# Patient Record
Sex: Female | Born: 1960
Health system: Southern US, Community
[De-identification: ages and names within clinical notes are randomized; demographics above are authoritative.]

## PROBLEM LIST (undated history)

## (undated) DIAGNOSIS — D242 Benign neoplasm of left breast: Secondary | ICD-10-CM

## (undated) DIAGNOSIS — E119 Type 2 diabetes mellitus without complications: Secondary | ICD-10-CM

## (undated) DIAGNOSIS — D249 Benign neoplasm of unspecified breast: Secondary | ICD-10-CM

## (undated) HISTORY — DX: Benign neoplasm of left breast: D24.2

## (undated) HISTORY — DX: Type 2 diabetes mellitus without complications: E11.9

## (undated) HISTORY — DX: Benign neoplasm of unspecified breast: D24.9

---

## 1993-08-14 HISTORY — PX: FINGER SURGERY: SHX640

## 1993-08-14 HISTORY — PX: CHOLECYSTECTOMY: SHX55

## 2000-08-14 HISTORY — PX: CHEST SURGERY: SHX595

## 2008-08-14 HISTORY — PX: BREAST EXCISIONAL BIOPSY: SUR124

## 2008-08-14 HISTORY — PX: BREAST SURGERY: SHX581

## 2008-10-21 ENCOUNTER — Ambulatory Visit: Payer: Self-pay | Admitting: Family Medicine

## 2008-11-11 ENCOUNTER — Ambulatory Visit: Payer: Self-pay | Admitting: Family Medicine

## 2009-03-24 ENCOUNTER — Ambulatory Visit: Payer: Self-pay | Admitting: General Surgery

## 2009-04-01 ENCOUNTER — Ambulatory Visit: Payer: Self-pay | Admitting: General Surgery

## 2009-12-06 ENCOUNTER — Ambulatory Visit: Payer: Self-pay | Admitting: General Surgery

## 2011-01-13 ENCOUNTER — Ambulatory Visit: Payer: Self-pay | Admitting: General Surgery

## 2011-08-11 ENCOUNTER — Emergency Department: Payer: Self-pay | Admitting: *Deleted

## 2012-01-18 ENCOUNTER — Ambulatory Visit: Payer: Self-pay | Admitting: General Surgery

## 2013-01-20 ENCOUNTER — Ambulatory Visit: Payer: Self-pay | Admitting: General Surgery

## 2013-01-22 ENCOUNTER — Encounter: Payer: Self-pay | Admitting: General Surgery

## 2013-02-03 ENCOUNTER — Ambulatory Visit (INDEPENDENT_AMBULATORY_CARE_PROVIDER_SITE_OTHER): Payer: BC Managed Care – PPO | Admitting: General Surgery

## 2013-02-03 ENCOUNTER — Encounter: Payer: Self-pay | Admitting: General Surgery

## 2013-02-03 VITALS — BP 142/80 | HR 78 | Resp 12 | Ht <= 58 in | Wt 183.0 lb

## 2013-02-03 DIAGNOSIS — D249 Benign neoplasm of unspecified breast: Secondary | ICD-10-CM

## 2013-02-03 HISTORY — DX: Benign neoplasm of unspecified breast: D24.9

## 2013-02-03 NOTE — Progress Notes (Signed)
Patient ID: Cindy Shah, female   DOB: 09/04/1959, 52 y.o.   MRN: [id number]  Chief Complaint  Patient presents with  . Follow-up    mammogram    HPI Cindy Shah is a 52 y.o. female who presents for a breast evaluation. The most recent mammogram was done on 01/20/13 cat 2.  Patient does perform regular self breast checks and gets regular mammograms done. No family history of breast cancer.    HPI  Past Medical History  Diagnosis Date  . Benign neoplasm of breast     Right  . Diabetes mellitus without complication     NIDDM    Past Surgical History  Procedure Laterality Date  . Breast surgery Right 2010    Mass excision  . Cesarean section  1999  . Finger surgery  1995  . Chest surgery  2002  . Cholecystectomy  1995    History reviewed. No pertinent family history.  Social History History  Substance Use Topics  . Smoking status: Never Smoker   . Smokeless tobacco: Never Used  . Alcohol Use: No    No Known Allergies  No current outpatient prescriptions on file.   No current facility-administered medications for this visit.    Review of Systems Review of Systems  Constitutional: Negative.   Respiratory: Negative.   Cardiovascular: Negative.     Blood pressure 142/80, pulse 78, resp. rate 12, height 4\' 7"  (1.397 m), weight 183 lb (83.008 kg).  Physical Exam Physical Exam  Constitutional: She is oriented to person, place, and time. She appears well-developed and well-nourished.  Eyes: No scleral icterus.  Pulmonary/Chest: Right breast exhibits no inverted nipple, no mass, no nipple discharge, no skin change and no tenderness. Left breast exhibits no inverted nipple, no mass, no nipple discharge, no skin change and no tenderness.  Lymphadenopathy:    She has no cervical adenopathy.    She has no axillary adenopathy.  Neurological: She is alert and oriented to person, place, and time.  Skin: Skin is warm and dry.    Data Reviewed Mammogram  reviewed   Assessment    Stable exam     Plan     Patient to return one year bilateral screening mammogram        Houston Zapien G 02/03/2013, 9:13 PM

## 2013-02-03 NOTE — Patient Instructions (Addendum)
Patient to return one year bilateral screening mammogram.  

## 2013-03-19 ENCOUNTER — Ambulatory Visit: Payer: Self-pay | Admitting: Family Medicine

## 2014-01-27 ENCOUNTER — Encounter: Payer: Self-pay | Admitting: General Surgery

## 2014-02-06 ENCOUNTER — Encounter: Payer: Self-pay | Admitting: General Surgery

## 2014-02-09 ENCOUNTER — Ambulatory Visit (INDEPENDENT_AMBULATORY_CARE_PROVIDER_SITE_OTHER): Payer: BC Managed Care – PPO | Admitting: General Surgery

## 2014-02-09 ENCOUNTER — Encounter: Payer: Self-pay | Admitting: General Surgery

## 2014-02-09 ENCOUNTER — Other Ambulatory Visit: Payer: BC Managed Care – PPO

## 2014-02-09 VITALS — BP 150/82 | HR 76 | Resp 14 | Ht <= 58 in | Wt 183.0 lb

## 2014-02-09 DIAGNOSIS — D249 Benign neoplasm of unspecified breast: Secondary | ICD-10-CM

## 2014-02-09 NOTE — Patient Instructions (Addendum)
Patient to return on 02/11/14 at 8:30am for left breast biopsy.  Call office for any new breast issues or concerns.

## 2014-02-09 NOTE — Progress Notes (Signed)
Patient ID: Cindy Shah, female   DOB: 09/04/1959, 53 y.o.   MRN: [id number]  Chief Complaint  Patient presents with  . Follow-up    mammogram    HPI [name] is a 53 y.o. female who presents for a breast evaluation. The most recent mammogram was done on 01/27/14. Patient does perform regular self breast checks and gets regular mammograms done. No new problems with the breasts at this time.     HPI  Past Medical History  Diagnosis Date  . Benign neoplasm of breast     Right  . Diabetes mellitus without complication     NIDDM    Past Surgical History  Procedure Laterality Date  . Breast surgery Right 2010    Mass excision  . Cesarean section  1999  . Finger surgery  1995  . Chest surgery  2002  . Cholecystectomy  1995    History reviewed. No pertinent family history.  Social History History  Substance Use Topics  . Smoking status: Never Smoker   . Smokeless tobacco: Never Used  . Alcohol Use: No    No Known Allergies  No current outpatient prescriptions on file.   No current facility-administered medications for this visit.    Review of Systems Review of Systems  Constitutional: Negative.   Respiratory: Negative.   Cardiovascular: Negative.     Blood pressure 150/82, pulse 76, resp. rate 14, height 4\' 7"  (1.397 m), weight 183 lb (83.008 kg).  Physical Exam Physical Exam  Constitutional: She is oriented to person, place, and time. She appears well-developed and well-nourished.  Eyes: Conjunctivae are normal. No scleral icterus.  Neck: Neck supple. No thyromegaly present.  Cardiovascular: Normal rate and regular rhythm.   No murmur heard. Pulmonary/Chest: Effort normal and breath sounds normal. Right breast exhibits no inverted nipple, no mass, no nipple discharge, no skin change and no tenderness. Left breast exhibits no inverted nipple, no mass, no nipple discharge, no skin change and no tenderness.  Abdominal: Soft. Bowel sounds are  normal. There is no tenderness.  Lymphadenopathy:    She has no cervical adenopathy.    She has no axillary adenopathy.  Neurological: She is alert and oriented to person, place, and time.  Skin: Skin is warm and dry.    Data Reviewed Mammogram reviewed- developing nodule mid upper outer quadrant left breast  Ultrasound done here shows dominant lobulated 2 cm mass 1-2 o'clock.   Assessment  New large mass in left breast likely fibroadenoma but needs tissue confirmation.  Core biopsy of large mass discussed.     Plan    Patient to return on 02/11/14 at 8:30am for left breast biopsy. Patient agreeable.        SANKAR,SEEPLAPUTHUR G 02/10/2014, 11:58 AM

## 2014-02-10 ENCOUNTER — Encounter: Payer: Self-pay | Admitting: General Surgery

## 2014-02-11 ENCOUNTER — Ambulatory Visit (INDEPENDENT_AMBULATORY_CARE_PROVIDER_SITE_OTHER): Payer: BC Managed Care – PPO | Admitting: General Surgery

## 2014-02-11 ENCOUNTER — Encounter: Payer: Self-pay | Admitting: General Surgery

## 2014-02-11 ENCOUNTER — Other Ambulatory Visit: Payer: BC Managed Care – PPO

## 2014-02-11 VITALS — BP 124/76 | HR 82 | Resp 12 | Ht <= 58 in | Wt 182.0 lb

## 2014-02-11 DIAGNOSIS — D242 Benign neoplasm of left breast: Secondary | ICD-10-CM

## 2014-02-11 DIAGNOSIS — D249 Benign neoplasm of unspecified breast: Secondary | ICD-10-CM

## 2014-02-11 HISTORY — PX: BREAST EXCISIONAL BIOPSY: SUR124

## 2014-02-11 NOTE — Progress Notes (Signed)
Patient ID: Cindy Shah, female   DOB: 09/04/1959, 53 y.o.   MRN: [id number]  The patient presents for a left breast biopsy. No new complaints at this time.  Core biopsy of left breast mass completed today.  If path shows benign fibroadenoma wil recheck in 3 mos. Interpreter present for entire duration of visit and procedure.

## 2014-02-11 NOTE — Patient Instructions (Signed)
Patient to return in 1 week for a nurse visit. Follow up in 3 months.      CARE AFTER BREAST BIOPSY  1. Leave the dressing on that your doctor applied after surgery. It is waterproof. You may bathe, shower and/or swim. The dressing will probably remain intact until your return office visit. If the dressing comes off, you will see small strips of tape against your skin on the incision. Do not remove these strips.  2. You may want to use a gauze,cloth or similar protection in your bra to prevent rubbing against your dressing and incision. This is not necessary, but you may feel more comfortable doing so.  3. It is recommended that you wear a bra day and night to give support to the breast. This will prevent the weight of the breast from pulling on the incision.  4. Your breast will feel hard and lumpy under the incision. Do not be alarmed. This is the underlying stitching of tissue. Softening of this tissue will occur in time.  5. Make sure you call the office and schedule an appointment in one week after your surgery. The office phone number is [phone]. [facility] may have already done this for you.  6. You will notice about a week after your office visit that the strips of the tape on your incision will begin to loosen. These may then be removed.  7. Report to your doctor any of the following:  * Severe pain not relieved by your pain medication  *Redness of the incision  * Drainage from the incision  *Fever greater than 101 degrees

## 2014-02-12 ENCOUNTER — Telehealth: Payer: Self-pay | Admitting: *Deleted

## 2014-02-12 LAB — PATHOLOGY

## 2014-02-12 NOTE — Telephone Encounter (Signed)
Patient notified by interpreter, Montey Hora, that her biopsy came back benign-fibroadenoma. This patient will come for nurse check up next week and will see Dr. Jamal Collin for follow up in 3 months. Patient verbalizes understanding.

## 2014-02-18 ENCOUNTER — Ambulatory Visit (INDEPENDENT_AMBULATORY_CARE_PROVIDER_SITE_OTHER): Payer: Self-pay | Admitting: *Deleted

## 2014-02-18 DIAGNOSIS — D249 Benign neoplasm of unspecified breast: Secondary | ICD-10-CM

## 2014-02-18 DIAGNOSIS — D242 Benign neoplasm of left breast: Secondary | ICD-10-CM

## 2014-02-18 NOTE — Patient Instructions (Signed)
Continue self breast exams. Call office for any new breast issues or concerns. 

## 2014-02-18 NOTE — Progress Notes (Signed)
Patient here today for follow up post left breast biopsy.  No dressing, steristrip in place and aware it may come off in one week.  Minimal bruising noted.  The patient is aware that a heating pad may be used for comfort as needed.  Aware of pathology. Follow up as scheduled.

## 2014-05-14 ENCOUNTER — Encounter: Payer: Self-pay | Admitting: General Surgery

## 2014-05-14 ENCOUNTER — Ambulatory Visit (INDEPENDENT_AMBULATORY_CARE_PROVIDER_SITE_OTHER): Payer: BC Managed Care – PPO | Admitting: General Surgery

## 2014-05-14 VITALS — BP 142/82 | HR 78 | Resp 16 | Ht <= 58 in | Wt 176.0 lb

## 2014-05-14 DIAGNOSIS — D242 Benign neoplasm of left breast: Secondary | ICD-10-CM

## 2014-05-14 NOTE — Patient Instructions (Addendum)
Continue self breast exams. Call office for any new breast issues or concerns. Autoexamen de [employer]  Teacher, English as a foreign language) El autoexamen de mamas puede detectar problemas de manera temprana, prevenir complicaciones mdicas significativas y posiblemente salvar su vida. Al hacerlo, podr familiarizarse con el aspecto y forma de sus [place], y observar cambios. Esto le permite descubrir cambios de manera precoz. Este autoexamen [employer] ofrece la tranquilidad de que sus senos estn en buen [place]. Una forma de aprender qu es normal para sus mamas y si sufren modificaciones es Field seismologist un autoexamen.   Si encuentra un bulto o algo que no estaba presente anteriormente, lo mejor es ponerse en contacto con su mdico inmediatamente. Otro hallazgo que debe ser evaluado por su mdico es la secrecin del pezn, especialmente si es con sangre; cambios en la piel o enrojecimiento; reas donde la piel parece estar tironeada (retrada) o nuevos bultos o protuberancias. El dolor en los senos es rara vez se asocia con el cncer (malignidad), pero tambin debe ser evaluado por un mdico.  CMO REALIZAR EL AUTOEXAMEN DE MAMAS  El mejor momento para examinar sus mamas es a los 5 a 7 das despus de finalizado el perodo menstrual. Durante la menstruacin, las mamas estn ms abultadas y puede haber ms dificultad para Pension scheme manager modificaciones. Si no menstra, ha llegado a la menopausia, o le han extirpado el tero (histerectomia), usted debe examinar sus senos a intervalos regulares, por ejemplo cada mes. Si est amamantando, examine sus senos despus de alimentar al beb o despus de usar un extractor de [place]. Los implantes mamarios no disminuyen el riesgo de bultos o tumores, por lo que debe seguir realizando el autoexamen de [employer] se recomienda. Hable con su mdico acerca de cmo determinar la diferencia entre el implante y el tejido [place]. Adems, debe consultar cuanta presin debe hacer durante el examen. Con  el tiempo se familiarizar con las variaciones de las mamas y se sentir ms cmoda para Restaurant manager, fast food. Para el autoexamen deber quitarse toda la ropa de la cintura para [place].  1.  Observe sus senos y pezones. Prese frente a un espejo en una habitacin con buena iluminacin. Con las [employer] caderas, presione las manos firmemente [place]. Busque diferencias en la forma, el contorno y el tamao de un pecho al otro (asimetras). [place] asimetras se incluyen arrugas, depresiones o protuberancias. Tambin, busque cambios en la piel, como reas enrojecidas o escamosas. Busque cambios en los pezones, como secreciones, hoyuelos, cambios en la posicin, o enrojecimiento. 2. [place] cuidadosamente sus senos. Es mucho mejor [employer] en la ducha o en la baera, [place] jabn o cuando est recostada sobre su espalda. Coloque el brazo (en el lado de la mama que se examina) por arriba de la cabeza. Use las yemas (no las puntas) de los tres dedos centrales de la mano opuesta para palpar. Comience en la zona de la axila, haga crculos de  de pulgada (2 cm) y vaya superponindolos. Utilice 3 niveles diferentes de presin (ligero, medio y [place]) en cada crculo antes de pasar al siguiente. Se necesita una presin ligera para sentir los tejidos ms cercanos a la piel. La presin media ayudar a sentir el tejido [employer] un poco ms profundo, mientras que se necesita una presin firme para palpar el tejido que se encuentra cerca de las [place]. Continuar superponiendo crculos y vaya hacia abajo, hasta sentir las [place], por debajo del [place]. Luego mueva un espacio del ancho de un dedo  hacia el centro del cuerpo. Siga con los crculos del  de pulgada (2 cm) mientras va lentamente hacia la clavcula, cerca de la base del cuello. Contine con el examen hacia arriba y hacia abajo con las 3 intensidades de presin Librarian, academic a la mitad del pecho. Hgalo con cada seno cuidadosamente, buscando bultos o  modificaciones. 3. Debe llevar un registro escrito con los cambios o los hallazgos normales que encuentre para cada seno. Si registra esta informacin, no tiene que depender slo de la memoria para Financial trader, la sensibilidad o la ubicacin de los [place]. Anote en qu momento se encuentra del ciclo menstrual, si usted todava est menstruando. El tejido [employer] puede tener algunos bultos o tejidos engrosados. Sin embargo, consulte a su mdico si usted Location manager.   SOLICITE ATENCIN MDICA SI:   Observa cambios en la forma, en el contorno o el tamao de las mamas o los pezones.   Hay modificaciones en la piel, como zonas enrojecidas o escamosas en las mamas o en los pezones.   Tiene una secrecin anormal en los pezones.   Siente un nuevo bulto o reas engrosadas de [place] anormal.  Document Released: 07/31/2005 Document Revised: 07/17/2012 [facility] Patient Information 2015 [employer], [place]. This information is not intended to replace advice given to you by your health care provider. Make sure you discuss any questions you have with your health care provider. Follow up in 3 months with left diagnostic mammogram and office visit with ultrasound.

## 2014-05-14 NOTE — Progress Notes (Signed)
Patient ID: Cindy Shah, female   DOB: 09/04/1959, 53 y.o.   MRN: [id number]  Chief Complaint  Patient presents with  . Follow-up    HPI Cindy Shah Cindy Shah is a 53 y.o. female.  Here today for follow up left breast biopsy 02-11-14.  She had a dominant mass that showed fibroadenoma. No new breast issues.   Cindy Shah present for interpretation.  HPI  Past Medical History  Diagnosis Date  . Benign neoplasm of breast     Right  . Diabetes mellitus without complication     NIDDM    Past Surgical History  Procedure Laterality Date  . Cesarean section  1999  . Finger surgery  1995  . Chest surgery  2002  . Cholecystectomy  1995  . Breast surgery Right 2010    Mass excision  . Breast biopsy Left 02-11-14    fibroadenoma    History reviewed. No pertinent family history.  Social History History  Substance Use Topics  . Smoking status: Never Smoker   . Smokeless tobacco: Never Used  . Alcohol Use: No    No Known Allergies  No current outpatient prescriptions on file.   No current facility-administered medications for this visit.    Review of Systems Review of Systems  Blood pressure 142/82, pulse 78, resp. rate 16, height 4\' 7"  (1.397 m), weight 176 lb (79.833 kg).  Physical Exam Physical Exam  Constitutional: She is oriented to person, place, and time. She appears well-developed and well-nourished.  Pulmonary/Chest: Right breast exhibits no inverted nipple, no mass, no nipple discharge, no skin change and no tenderness. Left breast exhibits no inverted nipple, no mass, no nipple discharge, no skin change and no tenderness.  Lymphadenopathy:    She has no cervical adenopathy.    She has no axillary adenopathy.  Neurological: She is alert and oriented to person, place, and time.  Skin: Skin is warm and dry.    Data Reviewed Last [place]. Pt also has a couple of tiny cysts.  Assessment    Stable physical exam.Mass in left breast is not palpable.    Plan     Follow up in 3 months with left diagnostic mammogram and office visit with ultrasound.       Torrey Ballinas G 05/15/2014, 10:37 AM

## 2014-05-15 ENCOUNTER — Encounter: Payer: Self-pay | Admitting: General Surgery

## 2014-06-15 ENCOUNTER — Encounter: Payer: Self-pay | Admitting: General Surgery

## 2014-09-03 ENCOUNTER — Encounter: Payer: Self-pay | Admitting: General Surgery

## 2014-09-03 ENCOUNTER — Ambulatory Visit (INDEPENDENT_AMBULATORY_CARE_PROVIDER_SITE_OTHER): Payer: BLUE CROSS/BLUE SHIELD | Admitting: General Surgery

## 2014-09-03 VITALS — BP 148/82 | HR 80 | Resp 12 | Ht <= 58 in | Wt 173.0 lb

## 2014-09-03 DIAGNOSIS — N63 Unspecified lump in breast: Secondary | ICD-10-CM

## 2014-09-03 DIAGNOSIS — N6320 Unspecified lump in the left breast, unspecified quadrant: Secondary | ICD-10-CM

## 2014-09-03 DIAGNOSIS — D242 Benign neoplasm of left breast: Secondary | ICD-10-CM

## 2014-09-03 NOTE — Patient Instructions (Addendum)
Stereotactic Breast Biopsy A stereotactic breast biopsy is a procedure in which mammography is used in the collection of a sample of breast tissue. Mammography is a type of X-ray exam of the breasts that produces an image called a mammogram. The mammogram allows your health care provider to precisely locate the area of the breast from which a tissue sample will be taken. The tissue is then examined under a microscope to see if cancerous cells are present. A breast biopsy is done when:   A lump, abnormality, or mass is seen in the breast on a breast X-ray (mammogram).   Small calcium deposits (calcifications) are seen in the breast.   The shape or appearance of the breasts changes.   The shape or appearance of the nipples changes. You may have unusual or bloody discharge coming from the nipples, or you may have crusting, retraction, or dimpling of the nipples. A breast biopsy can indicate if you need surgery or other treatment.  LET YOUR HEALTH CARE PROVIDER KNOW ABOUT:  Any allergies you have.  All medicines you are taking, including vitamins, herbs, eye drops, creams, and over-the-counter medicines.  Previous problems you or members of your family have had with the use of anesthetics.  Any blood disorders you have.  Previous surgeries you have had.  Medical conditions you have. RISKS AND COMPLICATIONS Generally, stereotactic breast biopsy is a safe procedure. However, as with any procedure, complications can occur. Possible complications include:  Infection at the needle-insertion site.   Bleeding or bruising after surgery.  The breast may become altered or deformed as a result of the procedure.  The needle may go through the chest wall into the lung area.  BEFORE THE PROCEDURE  Wear a supportive bra to the procedure.  You will be asked to remove jewelry, dentures, eyeglasses, metal objects, or clothing that might interfere with the X-ray images. You may want to leave  some of these objects at home.  Arrange for someone to drive you home after the procedure if desired. PROCEDURE  A stereotactic breast biopsy is done while you are awake. During the procedure, relax as much as possible. Let your health care provider know if you are uncomfortable, anxious, or in pain. Usually, the only discomfort felt during the procedure is caused by staying in one position for the length of the procedure. This discomfort can be reduced by carefully placed cushions. Most of the time the biopsy is done using a table with openings on it. You will be asked to lie facedown on the table and place your breasts through the openings. Your breast is compressed between metal plates to get good X-ray images. Your skin will be cleaned, and a numbing medicine (local anesthetic) will be injected. A small cut (incision) will be made in your breast. The tip of the biopsy needle will be directed through the incision. Several small pieces of suspicious tissue will be taken. Then, a final set of X-ray images will be obtained. If they show that the suspicious tissue has been mostly or completely removed, a small clip will be left at the biopsy site. This is done so that the biopsy site can be easily located if the results of the biopsy show that the tissue is cancerous.  After the procedure, the incision will be stitched (sutured) or taped and covered with a bandage (dressing). Your health care provider may apply a pressure dressing and an ice pack to prevent bleeding and swelling in the breast.  A stereotactic   breast biopsy can take 30 minutes or more. AFTER THE PROCEDURE  If you are doing well and have no problems, you will be allowed to go home.  Document Released: 04/29/2003 Document Revised: 08/05/2013 Document Reviewed: 02/27/2013 [facility] Patient Information 2015 [place], [place]. This information is not intended to replace advice given to you by your health care provider. Make sure you discuss any  questions you have with your health care provider.  Patient has been scheduled for a left breast stereotactic biopsy at [facility] for 09-14-14 at 3 pm. She will check-in at the [facility] at 2:30 pm. This patient is aware of date, time, and instructions. Patient verbalizes understanding.

## 2014-09-03 NOTE — Progress Notes (Signed)
Patient ID: Cindy Shah, female   DOB: 09/04/1959, 54 y.o.   MRN: [id number]  Chief Complaint  Patient presents with  . Follow-up    left breast mammgram    HPI Cindy Shah Cindy Shah is a 54 y.o. female who presents for a breast evaluation. The most recent mammogram and left breast  ultrasound was done on 08/28/14.   Patient does perform regular self breast checks and gets regular mammograms done. Patient had a left breast core biopsy 02-11-14 showing a fibroadenoma. She had a dominant mass in left UOQ  HPI  Past Medical History  Diagnosis Date  . Benign neoplasm of breast     Right  . Diabetes mellitus without complication     NIDDM    Past Surgical History  Procedure Laterality Date  . Cesarean section  1999  . Finger surgery  1995  . Chest surgery  2002  . Cholecystectomy  1995  . Breast surgery Right 2010    Mass excision  . Breast biopsy Left 02-11-14    fibroadenoma    History reviewed. No pertinent family history.  Social History History  Substance Use Topics  . Smoking status: Never Smoker   . Smokeless tobacco: Never Used  . Alcohol Use: No    No Known Allergies  No current outpatient prescriptions on file.   No current facility-administered medications for this visit.    Review of Systems Review of Systems  Constitutional: Negative.   Respiratory: Negative.   Cardiovascular: Negative.     Blood pressure 148/82, pulse 80, resp. rate 12, height 4\' 7"  (1.397 m), weight 173 lb (78.472 kg).  Physical Exam Physical Exam  Constitutional: She is oriented to person, place, and time. She appears well-developed and well-nourished.  Eyes: Conjunctivae are normal. No scleral icterus.  Neck: Neck supple.  Cardiovascular: Normal rate, regular rhythm and normal heart sounds.   Pulmonary/Chest: Effort normal and breath sounds normal. Right breast exhibits no inverted nipple, no mass, no nipple discharge and no skin change. Left breast exhibits no inverted  nipple, no mass, no nipple discharge, no skin change and no tenderness.  Abdominal: Soft. Bowel sounds are normal. There is no tenderness.  Lymphadenopathy:    She has no cervical adenopathy.    She has no axillary adenopathy.  Neurological: She is alert and oriented to person, place, and time.  Skin: Skin is warm and dry.    Data Reviewed Mammogram and ultrasound reviewed. The large mass that was biopsied last yr in left UOQ is unchanged. A deeper seated nodule in that area is larger now. US shows multiple tiny nodules.   Assessment   Fibroadenoma both breasts Small left breast nodule. It is larger than before.  Likely a benign finding but stereotactic core biopsy is reasonable given the enlarged size.    Plan    Discussed left breast stereo biopsy, patient agrees.   Interpreter present for duration of interview, exam and discussion.    Patient has been scheduled for a left breast stereotactic biopsy at [facility] for 09-14-14 at 3 pm. She will check-in at the [facility] at 2:30 pm. This patient is aware of date, time, and instructions. Patient verbalizes understanding.    Cindy,Shah G 09/04/2014, 3:37 PM

## 2014-09-04 ENCOUNTER — Encounter: Payer: Self-pay | Admitting: General Surgery

## 2014-09-04 DIAGNOSIS — D242 Benign neoplasm of left breast: Secondary | ICD-10-CM | POA: Insufficient documentation

## 2014-09-04 HISTORY — DX: Benign neoplasm of left breast: D24.2

## 2014-09-14 ENCOUNTER — Telehealth: Payer: Self-pay | Admitting: *Deleted

## 2014-09-14 NOTE — Telephone Encounter (Signed)
Per Estill Bamberg at the [facility], stereo equipment is down and they will not be able to complete patient's biopsy today.   Patient notified by [employer]. She will be contacted to reschedule once equipment is working.

## 2014-09-17 ENCOUNTER — Telehealth: Payer: Self-pay | Admitting: *Deleted

## 2014-09-17 NOTE — Telephone Encounter (Signed)
Spoke with patient via interpreter, stereo biopsy has been rescheduled to 09-21-14 at [facility].

## 2014-09-21 ENCOUNTER — Ambulatory Visit: Payer: Self-pay | Admitting: General Surgery

## 2014-09-21 DIAGNOSIS — R920 Mammographic microcalcification found on diagnostic imaging of breast: Secondary | ICD-10-CM

## 2014-09-23 ENCOUNTER — Telehealth: Payer: Self-pay | Admitting: *Deleted

## 2014-09-23 NOTE — Telephone Encounter (Signed)
Need to inform pt that pathology was no cancer. Need to set up appt (with interpreter)  to discuss having the area removed it is similar to what she had before per Dr Jamal Collin.

## 2014-09-23 NOTE — Telephone Encounter (Signed)
Spoke with patient thru Almyra Free with [employer] and informed patient of her results and let her know that Dr Jamal Collin advises that she have the area removed. Patient is agreement with this and is scheduled to be seen here on 09/29/14 at 10:30 am. An interpreter will be made available for this appointment.

## 2014-09-24 ENCOUNTER — Encounter: Payer: Self-pay | Admitting: General Surgery

## 2014-09-29 ENCOUNTER — Encounter: Payer: Self-pay | Admitting: General Surgery

## 2014-09-29 ENCOUNTER — Ambulatory Visit (INDEPENDENT_AMBULATORY_CARE_PROVIDER_SITE_OTHER): Payer: BLUE CROSS/BLUE SHIELD | Admitting: General Surgery

## 2014-09-29 DIAGNOSIS — D4862 Neoplasm of uncertain behavior of left breast: Secondary | ICD-10-CM

## 2014-09-29 NOTE — Patient Instructions (Addendum)
Patient to be scheduled for a left breast excision at [facility].   Patient is scheduled for surgery at [facility] on 10/08/14. She will pre admit at the hospital on 09/30/14 at 9:15 am. On 10/08/14 she will arrive at the [facility]. Patient is aware of dates, times, and instructions.

## 2014-09-29 NOTE — Progress Notes (Signed)
Patient ID: Cindy Shah, female   DOB: 09/04/1959, 54 y.o.   MRN: [id number]  Chief Complaint  Patient presents with  . Routine Post Op    left breast biopsy    HPI Cindy Shah is a 54 y.o. female here today following up from her left breast stereo biopsy done on 09/14/14. Interpreter present.  HPI  Past Medical History  Diagnosis Date  . Benign neoplasm of breast     Right  . Diabetes mellitus without complication     NIDDM    Past Surgical History  Procedure Laterality Date  . Cesarean section  1999  . Finger surgery  1995  . Chest surgery  2002  . Cholecystectomy  1995  . Breast surgery Right 2010    Mass excision  . Breast biopsy Left 02-11-14    fibroadenoma    History reviewed. No pertinent family history.  Social History History  Substance Use Topics  . Smoking status: Never Smoker   . Smokeless tobacco: Never Used  . Alcohol Use: No    No Known Allergies  No current outpatient prescriptions on file.   No current facility-administered medications for this visit.    Review of Systems Review of Systems  Constitutional: Negative.   Respiratory: Negative.   Cardiovascular: Negative.     Blood pressure 124/80, pulse 80, resp. rate 14, height 4\' 7"  (1.397 m), weight 173 lb (78.472 kg).  Physical Exam Physical Exam  Constitutional: She is oriented to person, place, and time. She appears well-developed and well-nourished.  Eyes: Conjunctivae are normal. No scleral icterus.  Neck: Neck supple.  Cardiovascular: Normal rate, regular rhythm and normal heart sounds.   Pulmonary/Chest: Effort normal and breath sounds normal.  Left breast stab incision   Looks clean and healing well.   Lymphadenopathy:    She has no cervical adenopathy.  Neurological: She is alert and oriented to person, place, and time.  Skin: Skin is warm and dry.    Data Reviewed Path reviewed-fibroepithelial lesion, cannot r/o Phylloides.  Assessment   Discussed path  with pt. Gave her option of excision of the mass versus close follow up. Pt wished to have excision. Feel at the same time the larger more anterior fibroadenoma can also be removed.    Plan   Surgery expained to pt and interpreter present for whole duration. Discussed left breast excision, patient agrees.     Patient is scheduled for surgery at [facility] on 10/08/14. She will pre admit at the hospital on 09/30/14 at 9:15 am. On 10/08/14 she will arrive at the [facility]. Patient is aware of dates, times, and instructions.    Kanye Depree G 09/29/2014, 11:34 AM

## 2014-09-30 ENCOUNTER — Ambulatory Visit: Payer: Self-pay | Admitting: General Surgery

## 2014-10-08 ENCOUNTER — Encounter: Payer: Self-pay | Admitting: General Surgery

## 2014-10-08 ENCOUNTER — Ambulatory Visit: Payer: Self-pay | Admitting: General Surgery

## 2014-10-08 DIAGNOSIS — D242 Benign neoplasm of left breast: Secondary | ICD-10-CM

## 2014-10-08 DIAGNOSIS — D4862 Neoplasm of uncertain behavior of left breast: Secondary | ICD-10-CM

## 2014-10-08 HISTORY — PX: BREAST LUMPECTOMY: SHX2

## 2014-10-09 ENCOUNTER — Encounter: Payer: Self-pay | Admitting: General Surgery

## 2014-10-12 ENCOUNTER — Encounter: Payer: Self-pay | Admitting: General Surgery

## 2014-10-13 ENCOUNTER — Telehealth: Payer: Self-pay | Admitting: *Deleted

## 2014-10-13 NOTE — Telephone Encounter (Signed)
-----   Message from Christene Lye, MD sent at 10/13/2014  8:43 AM EST ----- Pathology is benign. Please inform pt via interpreter

## 2014-10-13 NOTE — Telephone Encounter (Signed)
[place] interpreter utilized, Notified patient as instructed, patient pleased. Discussed follow-up appointments, patient agrees. No complaints.

## 2014-10-19 ENCOUNTER — Encounter: Payer: Self-pay | Admitting: General Surgery

## 2014-10-19 ENCOUNTER — Ambulatory Visit (INDEPENDENT_AMBULATORY_CARE_PROVIDER_SITE_OTHER): Payer: BLUE CROSS/BLUE SHIELD | Admitting: General Surgery

## 2014-10-19 VITALS — BP 150/82 | HR 76 | Resp 12 | Ht <= 58 in | Wt 169.0 lb

## 2014-10-19 DIAGNOSIS — D4862 Neoplasm of uncertain behavior of left breast: Secondary | ICD-10-CM

## 2014-10-19 NOTE — Progress Notes (Signed)
This is a 54 year old female here today for her post op left breast lumpectomy- 2 areas in UOQ- done on 10/08/14. Patient states she is doing well.  Path showed no atypia. Incision looks clean and healing well.  Patient to return in 3 month bilateral breast diagnotic mammogram.

## 2014-10-19 NOTE — Patient Instructions (Addendum)
Patient to return in 3 month bilateral breast diagnotic mammogram. .

## 2014-12-07 LAB — SURGICAL PATHOLOGY

## 2014-12-13 NOTE — Op Note (Signed)
PATIENT NAME:  Cindy Shah, FLICKER MR#:  [id number] DATE OF BIRTH:  09/04/1959  DATE OF PROCEDURE:  10/08/2014  PREOPERATIVE DIAGNOSIS: Two left breast masses, one fibroadenoma, the other a fibroepithelial lesion.   OPERATION PERFORMED: Excision of 2 left breast masses have with wire localization.   SURGEON: Ellwood Sayers, M.D.   ANESTHESIA: General.   COMPLICATIONS: None.   ESTIMATED BLOOD LOSS: Minimal.   DRAINS: None.   DESCRIPTION OF PROCEDURE: The patient underwent wire localization of 2 lesions in the upper outer quadrant. Both of which had been previously biopsied and a clip placed.  One was more anterior and was a fibroadenoma. The posterior one located right on the muscle was a fibroepithelial lesion and could not rule out a phyllodes The patient subsequently was brought to the Operating Room and put to sleep. The left breast was then prepped and draped out as a sterile field. Timeout was performed. The two wires were placed from the lateral aspect and were about 4 cm apart. We decided to use an incision in the middle of this in a curvilinear fashion. 49ml of 1% xylocaine mixed with half percent Marcaine was instilled, incision was then made. The posterior lesion was excised out first. The skin and subcutaneous tissue was elevated on the lateral aspect. The wire was freed from the skin, and then using the wire as a guide, dissection was carried out to the region of concern where the previous biopsy cavity was entered into and some old blood leaked out likely that the clip had fallen out at this time. This area was circumferentially excised out and this was noted to be lying right on the muscle. Part of the fascia was excised out. An additional amount of tissue anterior to this was also removed to ensured complete removal. Following this, the anterior lesion was excised out. The wire was, again, freed from the skin, and using the wire as a guide, about 1 to 1.5 cm fibroadenoma was identified at  the tip of the wire and this was excised out satisfactorily. Bleeding was controlled with cautery. The deeper tissues were approximated with 2-0 Vicryl and the skin closed with subcuticular 4-0 Vicryl, covered with LiquiBand. Procedure was well tolerated. She was subsequently returned to the recovery room in stable condition.    ____________________________ S.Robinette Haines, MD sgs:at D: 10/09/2014 08:58:00 ET T: 10/09/2014 19:25:30 ET JOB#: [id number]  cc: S.G. Jamal Collin, MD, <Dictator> [facility] Robinette Haines MD ELECTRONICALLY SIGNED 10/13/2014 11:06

## 2014-12-21 ENCOUNTER — Other Ambulatory Visit: Payer: Self-pay

## 2014-12-21 DIAGNOSIS — Z1231 Encounter for screening mammogram for malignant neoplasm of breast: Secondary | ICD-10-CM

## 2015-02-04 ENCOUNTER — Telehealth: Payer: Self-pay | Admitting: *Deleted

## 2015-02-04 ENCOUNTER — Ambulatory Visit: Payer: BLUE CROSS/BLUE SHIELD | Admitting: General Surgery

## 2015-02-04 NOTE — Telephone Encounter (Signed)
Called patient on 02/02/15 and 02/03/15 through language line to r/s her appointment with Dr.Sankar because patient r/s her mammo appt to July. Could not get in touch with patient and was not able to leave a message. I also canceled the interpreter on [facility] site.

## 2015-03-16 ENCOUNTER — Encounter: Payer: Self-pay | Admitting: *Deleted

## 2015-12-13 ENCOUNTER — Encounter: Payer: Self-pay | Admitting: General Surgery

## 2015-12-13 ENCOUNTER — Ambulatory Visit (INDEPENDENT_AMBULATORY_CARE_PROVIDER_SITE_OTHER): Payer: BLUE CROSS/BLUE SHIELD | Admitting: General Surgery

## 2015-12-13 VITALS — BP 128/64 | HR 76 | Resp 12 | Ht <= 58 in | Wt 173.0 lb

## 2015-12-13 DIAGNOSIS — N644 Mastodynia: Secondary | ICD-10-CM | POA: Diagnosis not present

## 2015-12-13 DIAGNOSIS — Z1239 Encounter for other screening for malignant neoplasm of breast: Secondary | ICD-10-CM

## 2015-12-13 NOTE — Progress Notes (Signed)
Patient ID: Levy Scotto, female   DOB: 09/04/1959, 55 y.o.   MRN: [id number]  Chief Complaint  Patient presents with  . Follow-up    left breast excision done on [id number]    HPI Kayce Lueck Dionne Milo is a 55 y.o. female here today for a evaluation of left breast pain . Patient had a left breast lumpectomy done on 10/08/14 for two benign fibroadenomas. The patient was scheduled to have a mammogram three months after but she was a no show.  She states the pain is dull, mild, intermittent, located in the outer left breast where she had her previous surgery. Her left upper arm is hurting. Denies injury to her breast and arm. The area has been hurting her for about 3 months. She does take Ibuprofen for the pain and she said that helps. I have reviewed the history of present illness with the patient.  Interpreter Windle Guard present for the exam. HPI  Past Medical History  Diagnosis Date  . Benign neoplasm of breast     Right  . Diabetes mellitus without complication (HCC)     NIDDM    Past Surgical History  Procedure Laterality Date  . Cesarean section  1999  . Finger surgery  1995  . Chest surgery  2002  . Cholecystectomy  1995  . Breast surgery Right 2010    Mass excision  . Breast biopsy Left 02-11-14    fibroadenoma    History reviewed. No pertinent family history.  Social History Social History  Substance Use Topics  . Smoking status: Never Smoker   . Smokeless tobacco: Never Used  . Alcohol Use: No    No Known Allergies  Current Outpatient Prescriptions  Medication Sig Dispense Refill  . canagliflozin (INVOKANA) 100 MG TABS tablet Take by mouth daily before breakfast.     No current facility-administered medications for this visit.    Review of Systems Review of Systems  Constitutional: Negative.   Respiratory: Negative.   Cardiovascular: Negative.     Blood pressure 128/64, pulse 76, resp. rate 12, height 4\' 7"  (1.397 m), weight 173 lb (78.472  kg).  Physical Exam Physical Exam  Constitutional: She is oriented to person, place, and time. She appears well-developed and well-nourished.  Eyes: Conjunctivae are normal. No scleral icterus.  Neck: Neck supple.  Pulmonary/Chest: Right breast exhibits no inverted nipple, no mass, no nipple discharge, no skin change and no tenderness. Left breast exhibits no inverted nipple, no mass, no nipple discharge, no skin change and no tenderness.  Lymphadenopathy:    She has no cervical adenopathy.  Neurological: She is alert and oriented to person, place, and time.  Skin: Skin is warm and dry.    Data Reviewed Prior notes reviewed   Assessment  Pain likely post surgical with no findings on exam. She is in need of follow up mammogram. Patient advised   Plan   Plan to schedule mammogram and follow up after.      PCP:  Raylene Miyamoto 12/13/2015, 10:34 AM

## 2015-12-13 NOTE — Patient Instructions (Signed)
Plan to schedule mammogram and follow up after.

## 2015-12-30 ENCOUNTER — Ambulatory Visit
Admission: RE | Admit: 2015-12-30 | Discharge: 2015-12-30 | Disposition: A | Discharge status: Home / Self Care | Payer: BLUE CROSS/BLUE SHIELD | Source: Ambulatory Visit | Attending: General Surgery | Admitting: General Surgery

## 2015-12-30 DIAGNOSIS — Z1239 Encounter for other screening for malignant neoplasm of breast: Secondary | ICD-10-CM

## 2015-12-30 DIAGNOSIS — Z1231 Encounter for screening mammogram for malignant neoplasm of breast: Secondary | ICD-10-CM | POA: Diagnosis present

## 2015-12-31 ENCOUNTER — Other Ambulatory Visit: Payer: Self-pay | Admitting: General Surgery

## 2015-12-31 DIAGNOSIS — R928 Other abnormal and inconclusive findings on diagnostic imaging of breast: Secondary | ICD-10-CM

## 2016-01-05 ENCOUNTER — Telehealth: Payer: Self-pay | Admitting: *Deleted

## 2016-01-05 NOTE — Telephone Encounter (Signed)
I called patient through language line and called patients daughter who translate for her but no answer, left message for patient to call the office back to reschedule her appointment that is scheduled for 01/06/16. Patient had mammogram and needs additional views. I also canceled the interpreter

## 2016-01-06 ENCOUNTER — Ambulatory Visit: Payer: BLUE CROSS/BLUE SHIELD | Admitting: General Surgery

## 2016-01-17 ENCOUNTER — Ambulatory Visit
Admission: RE | Admit: 2016-01-17 | Discharge: 2016-01-17 | Disposition: A | Discharge status: Home / Self Care | Payer: BLUE CROSS/BLUE SHIELD | Source: Ambulatory Visit | Attending: General Surgery | Admitting: General Surgery

## 2016-01-17 DIAGNOSIS — N63 Unspecified lump in breast: Secondary | ICD-10-CM | POA: Insufficient documentation

## 2016-01-17 DIAGNOSIS — R928 Other abnormal and inconclusive findings on diagnostic imaging of breast: Secondary | ICD-10-CM

## 2016-02-23 ENCOUNTER — Ambulatory Visit (INDEPENDENT_AMBULATORY_CARE_PROVIDER_SITE_OTHER): Payer: BLUE CROSS/BLUE SHIELD | Admitting: General Surgery

## 2016-02-23 ENCOUNTER — Encounter: Payer: Self-pay | Admitting: General Surgery

## 2016-02-23 VITALS — BP 136/86 | HR 80 | Resp 12 | Ht <= 58 in | Wt 172.0 lb

## 2016-02-23 DIAGNOSIS — N644 Mastodynia: Secondary | ICD-10-CM | POA: Diagnosis not present

## 2016-02-23 DIAGNOSIS — D4862 Neoplasm of uncertain behavior of left breast: Secondary | ICD-10-CM | POA: Diagnosis not present

## 2016-02-23 NOTE — Patient Instructions (Addendum)
The patient is aware to call back for any questions or concerns.The patient has been asked to return to the office in six months with an office ultrasound of the left breast.

## 2016-02-23 NOTE — Progress Notes (Signed)
Patient ID: Cindy Shah, female   DOB: 09/04/1959, 55 y.o.   MRN: [id number]  Chief Complaint  Patient presents with  . Follow-up    left breast mass    HPI Cindy Shah is a 55 y.o. female.  Here today for follow up left breast pain. She states she is not having any left breast pain, it is much better. Interpreter, Cindy Shah, present for interview, exam and discussion. I have reviewed the history of present illness with the patient.   HPI  Past Medical History  Diagnosis Date  . Benign neoplasm of breast     Right  . Diabetes mellitus without complication (HCC)     NIDDM    Past Surgical History  Procedure Laterality Date  . Cesarean section  1999  . Finger surgery  1995  . Chest surgery  2002  . Cholecystectomy  1995  . Breast surgery Right 2010    Mass excision  . Breast excisional biopsy Left 02-11-14    fibroadenoma  . Breast excisional biopsy Right 2010    benign    History reviewed. No pertinent family history.  Social History Social History  Substance Use Topics  . Smoking status: Never Smoker   . Smokeless tobacco: Never Used  . Alcohol Use: No    No Known Allergies  Current Outpatient Prescriptions  Medication Sig Dispense Refill  . canagliflozin (INVOKANA) 100 MG TABS tablet Take by mouth daily before breakfast.     . metFORMIN (GLUCOPHAGE-XR) 500 MG 24 hr tablet Take 500 mg by mouth daily with breakfast.   0   No current facility-administered medications for this visit.    Review of Systems Review of Systems  Constitutional: Negative.   Respiratory: Negative.   Cardiovascular: Negative.     Blood pressure 136/86, pulse 80, resp. rate 12, height 4\' 7"  (1.397 m), weight 172 lb (78.019 kg).  Physical Exam Physical Exam  Constitutional: She is oriented to person, place, and time. She appears well-developed and well-nourished.  Pulmonary/Chest: Right breast exhibits no inverted nipple, no mass, no nipple discharge, no skin change and  no tenderness. Left breast exhibits no inverted nipple, no mass, no nipple discharge, no skin change and no tenderness.  Lymphadenopathy:    She has no axillary adenopathy.  Neurological: She is alert and oriented to person, place, and time.  Skin: Skin is warm and dry.  Psychiatric: Her behavior is normal.    Data Reviewed Prior notes, mammogram and ultrasound reviewed. Showing a benign 7 mm nodule left breast.  Assessment    Benign left breast mass.    Plan    The patient has been asked to return to the office in 5 months with an office ultrasound of the left breast. The patient is aware to call back for any questions or concerns.       PCP:  Cindy Shah This information has been scribed by Karie Fetch RN, BSN,BC.   Niketa Turner G 02/24/2016, 3:14 PM

## 2016-02-24 ENCOUNTER — Encounter: Payer: Self-pay | Admitting: General Surgery

## 2016-07-24 ENCOUNTER — Inpatient Hospital Stay: Payer: Self-pay

## 2016-07-24 ENCOUNTER — Ambulatory Visit (INDEPENDENT_AMBULATORY_CARE_PROVIDER_SITE_OTHER): Payer: BLUE CROSS/BLUE SHIELD | Admitting: General Surgery

## 2016-07-24 ENCOUNTER — Encounter: Payer: Self-pay | Admitting: General Surgery

## 2016-07-24 VITALS — BP 140/78 | HR 72 | Resp 14 | Ht <= 58 in | Wt 174.0 lb

## 2016-07-24 DIAGNOSIS — N6320 Unspecified lump in the left breast, unspecified quadrant: Secondary | ICD-10-CM | POA: Diagnosis not present

## 2016-07-24 NOTE — Patient Instructions (Addendum)
   Biopsia de mama, cuidados posteriores (Breast Biopsy, Care After) Estas indicaciones le proporcionan informacin acerca de cmo deber cuidarse despus del procedimiento. El mdico tambin podr darle instrucciones especficas. Comunquese con el mdico si tiene algn problema o tiene preguntas despus del procedimiento. [place] los medicamentos de venta libre y los recetados solamente como se lo haya indicado el mdico.  No conduzca durante 24horas si le administraron un sedante.  No beba alcohol si toma analgsicos.  No conduzca ni use maquinaria pesada mientras toma analgsicos recetados. Cuidado del Environmental consultant de la biopsia   Siga las indicaciones del mdico en lo que respecta al cuidado del corte de la ciruga (incisin) o el lugar de la puncin. Haga lo siguiente:  [employer] con agua y jabn antes de cambiarse la venda. Use un desinfectante para manos si no dispone de [place].  Cambie el vendaje como se lo haya indicado el mdico.  No retire los puntos (suturas), el [employer] para la piel o las tiras [place]. Tal vez deban dejarse puestos en la piel durante 2semanas o ms tiempo. Si las tiras [place] se despegan y se enroscan, puede recortar los bordes sueltos. No retire las tiras [facility] por completo a menos que el mdico lo autorice.  Si le colocaron puntos, djelos secar cuando tome un bao o una ducha.  Controle el corte o TEFL teacher de la puncin todos los das para detectar signos de infeccin. Est atenta a los siguientes signos:  Aumento del enrojecimiento, la hinchazn o Conservation officer, historic buildings.  Ms lquido Delorise Shiner.  Calor.  Pus o mal olor.  Proteja la zona de la biopsia. No deje que la zona se inflame. Actividad   Evite las actividades que podran tironear y abrir el sitio de la biopsia.  No elongue.  No se estire.  No haga ejercicios.  No practique deportes.  Evite levantar objetos que pesen ms de 3libras  (1,4kg).  Retome sus actividades habituales como se lo haya indicado el mdico. Pregntele al mdico qu actividades son seguras para usted. Instrucciones generales   Siga su dieta habitual.  Use un buen sostn de soporte durante el tiempo que le indique su mdico.  Realcese controles para detectar exceso de lquido en el cuerpo (linfedema) con la frecuencia que le haya indicado el mdico.  Concurra a todas las visitas de control como se lo haya indicado el mdico. Esto es importante. SOLICITE AYUDA SI:  Tiene ms enrojecimiento, hinchazn o dolor en el lugar de la biopsia.  Observa ms lquido o sangre que salen del sitio de la biopsia.  El sitio de la biopsia est caliente al tacto.  Tiene pus o percibe mal olor que proviene del lugar de la biopsia.  El Environmental consultant de la biopsia se abre despus de que le han retirado los puntos, las grapas o las tiras [place].  Tiene una erupcin cutnea.  Tiene fiebre. SOLICITE AYUDA DE INMEDIATO SI:  Aumenta el sangrado (ms de una pequea mancha) en el lugar de la biopsia.  Tiene dificultad para respirar.  Tiene rayas rojas alrededor del lugar de la biopsia. Esta informacin no tiene Marine scientist el consejo del mdico. Asegrese de hacerle al mdico cualquier pregunta que tenga. Document Released: 01/30/2012 Document Revised: 11/22/2015 Document Reviewed: 05/04/2015 Elsevier Interactive Patient Education  2017 [employer].

## 2016-07-24 NOTE — Progress Notes (Signed)
Patient ID: Cindy Shah, female   DOB: 09/04/1959, 55 y.o.   MRN: [id number]  Chief Complaint  Patient presents with  . Follow-up    Left breast ultrasound    HPI Cindy Shah is a 55 y.o. female is here today for a 5 month left breast follow up left breast mass and ultrasound. Interpreter Real Cons is present. Patient states no breast symptoms. I have reviewed the history of present illness with the patient.  HPI  Past Medical History:  Diagnosis Date  . Benign neoplasm of breast    Right  . Diabetes mellitus without complication (HCC)    NIDDM    Past Surgical History:  Procedure Laterality Date  . BREAST EXCISIONAL BIOPSY Left 02-11-14   fibroadenoma  . BREAST EXCISIONAL BIOPSY Right 2010   benign  . BREAST SURGERY Right 2010   Mass excision  . CESAREAN SECTION  1999  . CHEST SURGERY  2002  . CHOLECYSTECTOMY  1995  . FINGER SURGERY  1995    No family history on file.  Social History Social History  Substance Use Topics  . Smoking status: Never Smoker  . Smokeless tobacco: Never Used  . Alcohol use No    No Known Allergies  Current Outpatient Prescriptions  Medication Sig Dispense Refill  . canagliflozin (INVOKANA) 100 MG TABS tablet Take by mouth daily before breakfast.     . metFORMIN (GLUCOPHAGE-XR) 500 MG 24 hr tablet Take 500 mg by mouth daily with breakfast.   0   No current facility-administered medications for this visit.     Review of Systems Review of Systems  Constitutional: Negative.   Respiratory: Negative.   Cardiovascular: Negative.     Blood pressure 140/78, pulse 72, resp. rate 14, height 4\' 10"  (1.473 m), weight 174 lb (78.9 kg).  Physical Exam Physical Exam  Constitutional: She is oriented to person, place, and time. She appears well-developed and well-nourished.  Neck: Neck supple.  Cardiovascular: Normal rate and regular rhythm.   Pulmonary/Chest: Right breast exhibits no inverted nipple, no mass, no nipple  discharge, no skin change and no tenderness. Left breast exhibits no inverted nipple, no nipple discharge, no skin change and no tenderness.  Lymphadenopathy:    She has no cervical adenopathy.    She has no axillary adenopathy.  Neurological: She is alert and oriented to person, place, and time.  Skin: Skin is warm and dry.    Data Reviewed Previous notes [place] from 6 mos ago Repeat US of left breast 3 ocl 6cmfn shows the same oval hypoechoic mass, measures 0.79cm in max size. Also directly posterior to this there is a second 35mm mass Assessment    Left breast masses- one is slightly larger, new one posterior to the known mass  Given a change from 6 mos ago core biopsy recommended. Pt agreeable.  Plan   Core biopsy completed and clip placed. If biopsy is benign will see her back in 6 mos with bilateral diagnostic mammogram   This information has been scribed by Verlene Mayer, CMA         [facility] G 07/24/2016, 9:51 AM

## 2016-07-25 ENCOUNTER — Telehealth: Payer: Self-pay | Admitting: *Deleted

## 2016-07-25 NOTE — Telephone Encounter (Signed)
Notified patient as instructed, patient pleased. Discussed follow-up appointments, patient agrees  

## 2016-07-25 NOTE — Telephone Encounter (Signed)
-----   Message from Christene Lye, MD sent at 07/25/2016 12:59 PM EST ----- Inform pt path-benign fibroadenoma. Follow up as recommended

## 2016-11-23 ENCOUNTER — Other Ambulatory Visit: Payer: Self-pay

## 2016-11-23 DIAGNOSIS — N6320 Unspecified lump in the left breast, unspecified quadrant: Secondary | ICD-10-CM

## 2017-01-18 ENCOUNTER — Ambulatory Visit
Admission: RE | Admit: 2017-01-18 | Discharge: 2017-01-18 | Disposition: A | Discharge status: Home / Self Care | Payer: BLUE CROSS/BLUE SHIELD | Source: Ambulatory Visit | Attending: General Surgery | Admitting: General Surgery

## 2017-01-18 DIAGNOSIS — N6320 Unspecified lump in the left breast, unspecified quadrant: Secondary | ICD-10-CM | POA: Diagnosis not present

## 2017-01-25 ENCOUNTER — Encounter: Payer: Self-pay | Admitting: General Surgery

## 2017-01-25 ENCOUNTER — Ambulatory Visit (INDEPENDENT_AMBULATORY_CARE_PROVIDER_SITE_OTHER): Payer: BLUE CROSS/BLUE SHIELD | Admitting: General Surgery

## 2017-01-25 VITALS — BP 130/82 | HR 92 | Resp 12 | Ht <= 58 in | Wt 173.0 lb

## 2017-01-25 DIAGNOSIS — N6320 Unspecified lump in the left breast, unspecified quadrant: Secondary | ICD-10-CM

## 2017-01-25 NOTE — Patient Instructions (Addendum)
Follow up with PCP in 1 year with screening bilateral mammogram. Call office with any questions or concerns.

## 2017-01-25 NOTE — Progress Notes (Signed)
Patient ID: Cindy Shah, female   DOB: 09/04/1959, 56 y.o.   MRN: [id number]  Chief Complaint  Patient presents with  . Follow-up    HPI Cindy Shah is a 56 y.o. female who presents for a breast evaluation. The most recent mammogram was done on 01/18/2017.  Patient does perform regular self breast checks and gets regular mammograms done.  Interpreter, Ronnald Collum at visit. Patient reports breast/left sided chest wall pain with exertion.  HPI  Past Medical History:  Diagnosis Date  . Benign neoplasm of breast    Right  . Diabetes mellitus without complication (HCC)    NIDDM    Past Surgical History:  Procedure Laterality Date  . BREAST EXCISIONAL BIOPSY Left 02-11-14   fibroadenoma  . BREAST EXCISIONAL BIOPSY Right 2010   benign  . BREAST SURGERY Right 2010   Mass excision  . CESAREAN SECTION  1999  . CHEST SURGERY  2002  . CHOLECYSTECTOMY  1995  . FINGER SURGERY  1995    No family history on file.  Social History Social History  Substance Use Topics  . Smoking status: Never Smoker  . Smokeless tobacco: Never Used  . Alcohol use No    No Known Allergies  Current Outpatient Prescriptions  Medication Sig Dispense Refill  . canagliflozin (INVOKANA) 100 MG TABS tablet Take by mouth daily before breakfast.     . metFORMIN (GLUCOPHAGE-XR) 500 MG 24 hr tablet Take 500 mg by mouth daily with breakfast.   0   No current facility-administered medications for this visit.     Review of Systems Review of Systems  Constitutional: Negative.   Respiratory: Negative.   Cardiovascular: Negative.     Blood pressure 130/82, pulse 92, resp. rate 12, height 4\' 10"  (1.473 m), weight 173 lb (78.5 kg).  Physical Exam Physical Exam  Constitutional: She is oriented to person, place, and time. She appears well-developed and well-nourished.  Eyes: Conjunctivae are normal. No scleral icterus.  Neck: Neck supple.  Cardiovascular: Normal rate, regular rhythm and normal heart  sounds.   Pulmonary/Chest: Effort normal and breath sounds normal. Right breast exhibits no inverted nipple, no mass, no nipple discharge, no skin change and no tenderness. Left breast exhibits no inverted nipple, no mass, no nipple discharge, no skin change and no tenderness.  Abdominal: Soft. Normal appearance and bowel sounds are normal. There is no hepatomegaly.  Lymphadenopathy:    She has no cervical adenopathy.    She has no axillary adenopathy.  Neurological: She is alert and oriented to person, place, and time.  Skin: Skin is warm and dry.  Psychiatric: She has a normal mood and affect. Her behavior is normal.    Data Reviewed Prior notes and mammogram reviewed.  Assessment    History of excision of fibroadenomas in both breasts.  Stable physical exam    Plan     Follow up with PCP in 1 year with screening bilateral mammogram. Call office with any questions or concerns.    HPI, Physical Exam, Assessment and Plan have been scribed under the direction and in the presence of Mckinley Jewel, MD  Gaspar Cola, CMA  I have completed the exam and reviewed the above documentation for accuracy and completeness.  I agree with the above.  Haematologist has been used and any errors in dictation or transcription are unintentional.  Seeplaputhur G. Jamal Collin, M.D., F.A.C.S.   Junie Panning G 01/25/2017, 2:07 PM

## 2018-01-01 ENCOUNTER — Other Ambulatory Visit: Payer: Self-pay | Admitting: Internal Medicine

## 2018-01-01 DIAGNOSIS — Z1231 Encounter for screening mammogram for malignant neoplasm of breast: Secondary | ICD-10-CM

## 2018-01-29 ENCOUNTER — Ambulatory Visit
Admission: RE | Admit: 2018-01-29 | Discharge: 2018-01-29 | Disposition: A | Discharge status: Home / Self Care | Payer: 59 | Source: Ambulatory Visit | Attending: Internal Medicine | Admitting: Internal Medicine

## 2018-01-29 DIAGNOSIS — Z1231 Encounter for screening mammogram for malignant neoplasm of breast: Secondary | ICD-10-CM | POA: Diagnosis not present

## 2018-10-22 ENCOUNTER — Ambulatory Visit (INDEPENDENT_AMBULATORY_CARE_PROVIDER_SITE_OTHER): Payer: 59 | Admitting: Surgery

## 2018-10-22 ENCOUNTER — Other Ambulatory Visit: Payer: Self-pay

## 2018-10-22 ENCOUNTER — Encounter: Payer: Self-pay | Admitting: Surgery

## 2018-10-22 VITALS — BP 160/82 | HR 106 | Temp 97.7°F | Resp 18 | Ht <= 58 in | Wt 174.4 lb

## 2018-10-22 DIAGNOSIS — N644 Mastodynia: Secondary | ICD-10-CM | POA: Diagnosis not present

## 2018-10-22 NOTE — Patient Instructions (Addendum)
Patient will be asked to return to the office in 2 weeks She is due for her annual bilateral screening mammogram in June. The patient is aware to use a heating pad as needed for comfort. Gentle massage the left breast for discomfort  Recommend taking ibuprofen for the breast pain

## 2018-10-22 NOTE — Progress Notes (Signed)
Surgical Clinic Progress/Follow-up Note   HPI:  58 y.o. Female presents to clinic for Left breast pain. Patient underwent Left breast lumpectomy 4 years ago with Dr. Jamal Collin with benign pathology. Post-operatively, she says she experienced Left lateral breast pain near her incision, but says she was reassured it was normal. She now says it never completely resolved, but recently has become more bothersome. Via certified medical Spanish-English translator, patient clarifies it's "not a severe pain", but has been causing her discomfort when she's tried to sleep on her Left side. She has not attempted massage, warm compresses, ice, or NSAIDs and denies any Left breast redness, drainage, fever/chills, palpable mass(es), nipple drainage, unintentional weight loss, CP, or SOB.   Review of Systems:  Constitutional: denies any other weight loss, fever, chills, or sweats  Eyes: denies any other vision changes, history of eye injury  ENT: denies sore throat, hearing problems  Respiratory: denies shortness of breath, wheezing  Cardiovascular: denies chest pain, palpitations Breast: pain, mass(es), and nipple drainage as per interval history Gastrointestinal: denies abdominal pain, N/V, or diarrhea Musculoskeletal: denies any other joint pains or cramps  Skin: Denies any other rashes or skin discolorations  Neurological: denies any other headache, dizziness, weakness  Psychiatric: denies any other depression, anxiety  All other review of systems: otherwise negative   Vital Signs:  BP (!) 160/82   Pulse (!) 106   Temp 97.7 F (36.5 C) (Temporal)   Resp 18   Ht 4\' 10"  (1.473 m)   Wt 174 lb 6.4 oz (79.1 kg)   SpO2 96%   BMI 36.45 kg/m    Physical Exam:  Constitutional:  -- Obese body habitus  -- Awake, alert, and oriented x3  Eyes:  -- Pupils equally round and reactive to light  -- No scleral icterus  Ear, nose, throat:  -- No jugular venous distension  -- No nasal drainage,  bleeding Pulmonary:  -- No crackles -- Equal breath sounds bilaterally -- Breathing non-labored at rest Cardiovascular:  -- S1, S2 present  -- No pericardial rubs  Breast: -- Focal tenderness to palpation ~2 cm medial to Left lateral breast post-surgical scar without any associated mass(es), but appreciable tender to palpation chest wall tightness/nodularity -- No overlying erythema or drainage, no nipple drainage, or Left axillary lymphadenopathy -- Also a well-healed Left upper breast burn scar Gastrointestinal:  -- Soft, nontender, non-distended, no guarding/rebound  -- No abdominal masses appreciated, pulsatile or otherwise  Musculoskeletal / Integumentary:  -- Wounds or skin discoloration: None appreciated except as described above (Breast)  -- Extremities: B/L UE and LE FROM, hands and feet warm, no edema  Neurologic:  -- Motor function: intact and symmetric  -- Sensation: intact and symmetric   Laboratory studies:  CBC: No results found for: WBC, RBC BMP: No results found for: GLUCOSE, POTASSIUM, CHLORIDE, CO2, BUN, CREATININE, CALCIUM   Imaging: No new pertinent imaging studies available for review at this time   Assessment:  58 y.o. yo Female with a problem list including...  Patient Active Problem List   Diagnosis Date Noted  . Benign neoplasm of left breast 09/04/2014  . Fibroadenoma of breast 02/03/2013    presents to clinic for evaluation of Left breast pain medial to prior Left breast lumpectomy site (with benign surgical pathology) and associated with what feels like pectoral muscle tightness/nodularity.  Plan:   - massage, warm compresses or ice, and ibuprofen/NSAIDs  - return to clinic in 2 weeks to reassess  - if pain improved, will  plan to follow-up annual B/L screening mammogram in June  - may consider imaging sooner if pain persists despite interventions  - instructed to call office if any questions or concerns  All of the above recommendations were  discussed with the patient, and all of patient's questions were answered to her expressed satisfaction.  -- Marilynne Drivers Rosana Hoes, MD, [place]: [facility] - Partnering for exceptional care. Office: [phone]

## 2018-11-07 ENCOUNTER — Ambulatory Visit: Payer: 59 | Admitting: Surgery

## 2018-11-28 ENCOUNTER — Ambulatory Visit: Payer: 59 | Admitting: Surgery

## 2018-12-31 ENCOUNTER — Ambulatory Visit: Payer: 59 | Admitting: Surgery

## 2019-01-02 ENCOUNTER — Ambulatory Visit: Payer: 59 | Admitting: Surgery

## 2019-01-27 ENCOUNTER — Other Ambulatory Visit: Payer: Self-pay

## 2019-01-27 ENCOUNTER — Encounter: Payer: BC Managed Care – PPO | Attending: Internal Medicine | Admitting: *Deleted

## 2019-01-27 ENCOUNTER — Encounter: Payer: Self-pay | Admitting: *Deleted

## 2019-01-27 VITALS — BP 124/78 | Ht <= 58 in | Wt 167.8 lb

## 2019-01-27 DIAGNOSIS — E669 Obesity, unspecified: Secondary | ICD-10-CM | POA: Diagnosis not present

## 2019-01-27 DIAGNOSIS — Z6836 Body mass index (BMI) 36.0-36.9, adult: Secondary | ICD-10-CM | POA: Diagnosis not present

## 2019-01-27 DIAGNOSIS — Z713 Dietary counseling and surveillance: Secondary | ICD-10-CM | POA: Insufficient documentation

## 2019-01-27 DIAGNOSIS — E119 Type 2 diabetes mellitus without complications: Secondary | ICD-10-CM | POA: Diagnosis not present

## 2019-01-27 NOTE — Patient Instructions (Signed)
Check blood sugars 2 x day before breakfast and 2 hrs after supper every day Bring blood sugar records to the next appointment  Exercise: Begin walking for  10 minutes   3 days a week and gradually increase to 30 minutes 5 x week  Eat 3 meals day, 1 snack a day Space meals 4-6 hours apart Don't skip meals  Complete 3 Day Food Record and bring to next appt  Rotate injection sites  Return for appointment on: Monday February 17, 2019 at 9:00 am with [facility] (dietitian)

## 2019-01-27 NOTE — Progress Notes (Signed)
Diabetes Self-Management Education  Visit Type: First/Initial  Appt. Start Time:  0910 Appt. End Time: 6578  01/27/2019  Cindy Shah, identified by name and date of birth, is a 58 y.o. female with a diagnosis of Diabetes: Type 2.   ASSESSMENT  Blood pressure 124/78, height 4\' 9"  (1.448 m), weight 167 lb 12.8 oz (76.1 kg). Body mass index is 36.31 kg/m.  Diabetes Self-Management Education - 01/27/19 1330      Visit Information   Visit Type  First/Initial      Initial Visit   Diabetes Type  Type 2    Are you currently following a meal plan?  Yes    What type of meal plan do you follow?  ""try to cut down on carbohydrates"    Are you taking your medications as prescribed?  Yes    Date Diagnosed  2 years ago      Health Coping   How would you rate your overall health?  Good      Psychosocial Assessment   Patient Belief/Attitude about Diabetes  Motivated to manage diabetes    Self-care barriers  English as a second language;Low literacy    Self-management support  Doctor's office;Family    Other persons present  Interpreter;Family Member   daughter   Patient Concerns  Nutrition/Meal planning;Medication;Monitoring;Healthy Lifestyle;Weight Control;Glycemic Control    Special Needs  Other (comment)   Spanish materials   Preferred Learning Style  Auditory    Learning Readiness  Change in progress    How often do you need to have someone help you when you read instructions, pamphlets, or other written materials from your doctor or pharmacy?  5 - Always   if written in English   What is the last grade level you completed in school?  4th      Pre-Education Assessment   Patient understands the diabetes disease and treatment process.  Needs Instruction    Patient understands incorporating nutritional management into lifestyle.  Needs Instruction    Patient undertands incorporating physical activity into lifestyle.  Needs Instruction    Patient understands using  medications safely.  Needs Instruction    Patient understands monitoring blood glucose, interpreting and using results  Needs Review    Patient understands prevention, detection, and treatment of acute complications.  Needs Instruction    Patient understands prevention, detection, and treatment of chronic complications.  Needs Instruction    Patient understands how to develop strategies to address psychosocial issues.  Needs Instruction    Patient understands how to develop strategies to promote health/change behavior.  Needs Instruction      Complications   Last HgB A1C per patient/outside source  9.9 %   01/11/2019   How often do you check your blood sugar?  1-2 times/day    Fasting Blood glucose range (mg/dL)  70-129    Postprandial Blood glucose range (mg/dL)  70-129    Have you had a dilated eye exam in the past 12 months?  Yes    Have you had a dental exam in the past 12 months?  No    Are you checking your feet?  No      Dietary Intake   Breakfast  skips    Snack (morning)  sometimes snack of almonds    Lunch  fish, cactus, salads, roasted veggies (squash, broccoli, cauliflower, spinach)    Dinner  mostly chicken and fish, black beans, tomatoes, carrots, cuccumbers, green beans, tortilla, corn    Beverage(s)  water      Exercise   Exercise Type  ADL's      Patient Education   Previous Diabetes Education  No    Disease state   Definition of diabetes, type 1 and 2, and the diagnosis of diabetes;Factors that contribute to the development of diabetes    Nutrition management   Role of diet in the treatment of diabetes and the relationship between the three main macronutrients and blood glucose level;Food label reading, portion sizes and measuring food.;Reviewed blood glucose goals for pre and post meals and how to evaluate the patients' food intake on their blood glucose level.    Physical activity and exercise   Role of exercise on diabetes management, blood pressure control and  cardiac health.    Medications  Taught/reviewed Ozempic injection, site rotation, insulin storage and needle disposal.;Reviewed patients medication for diabetes, action, purpose, timing of dose and side effects.    Monitoring  Purpose and frequency of SMBG.;Taught/discussed recording of test results and interpretation of SMBG.;Identified appropriate SMBG and/or A1C goals.    Chronic complications  Relationship between chronic complications and blood glucose control    Psychosocial adjustment  Identified and addressed patients feelings and concerns about diabetes      Individualized Goals (developed by patient)   Reducing Risk  Improve blood sugars Decrease medications Prevent diabetes complications Lose weight Lead a healthier lifestyle     Outcomes   Expected Outcomes  Demonstrated interest in learning. Expect positive outcomes       Individualized Plan for Diabetes Self-Management Training:   Learning Objective:  Patient will have a greater understanding of diabetes self-management. Patient education plan is to attend individual and/or group sessions per assessed needs and concerns.   Plan:   Patient Instructions  Check blood sugars 2 x day before breakfast and 2 hrs after supper every day Bring blood sugar records to the next appointment Exercise: Begin walking for  10 minutes   3 days a week and gradually increase to 30 minutes 5 x week Eat 3 meals day, 1 snack a day Space meals 4-6 hours apart Don't skip meals Complete 3 Day Food Record and bring to next appt Rotate injection sites Return for appointment on: Monday February 17, 2019 at 9:00 am with [facility] (dietitian)   Expected Outcomes:  Demonstrated interest in learning. Expect positive outcomes  Education material provided:  General Meal Planning Guidelines (Spanish) Simple Meal Plan (Spanish) 3 Day Food Record  If problems or questions, patient to contact team via:   Johny Drilling, [place], [place], CDE [phone]  Future  DSME appointment: February 17, 2019 with the dietitian

## 2019-02-17 ENCOUNTER — Ambulatory Visit: Payer: BC Managed Care – PPO | Admitting: Dietician

## 2019-02-19 ENCOUNTER — Encounter: Payer: Self-pay | Admitting: Dietician

## 2019-02-19 NOTE — Progress Notes (Signed)
Patient did not keep her appointment scheduled for 02/17/19. [place] interpreter called to reschedule, but was unable to reach patient on either phone number provided, and was unable to leave a message.   Called again today with same results.  Sent letter to referring provider.

## 2019-02-20 ENCOUNTER — Telehealth: Payer: Self-pay | Admitting: *Deleted

## 2019-02-20 NOTE — Telephone Encounter (Signed)
Received voice mail from patient's daughter Corky Sing that patient couldn't come to appointment tomorrow and wanted to cancel. Phone call to daughter and informed her that patient's appt was on Monday. She reports that patient is going out of town and she will call back to reschedule when she returns.

## 2019-03-21 DIAGNOSIS — I10 Essential (primary) hypertension: Secondary | ICD-10-CM | POA: Diagnosis not present

## 2019-03-21 DIAGNOSIS — E119 Type 2 diabetes mellitus without complications: Secondary | ICD-10-CM | POA: Diagnosis not present

## 2019-03-21 DIAGNOSIS — E669 Obesity, unspecified: Secondary | ICD-10-CM | POA: Diagnosis not present

## 2019-04-18 DIAGNOSIS — E669 Obesity, unspecified: Secondary | ICD-10-CM | POA: Diagnosis not present

## 2019-04-18 DIAGNOSIS — I10 Essential (primary) hypertension: Secondary | ICD-10-CM | POA: Diagnosis not present

## 2019-04-18 DIAGNOSIS — E781 Pure hyperglyceridemia: Secondary | ICD-10-CM | POA: Diagnosis not present

## 2019-04-18 DIAGNOSIS — E119 Type 2 diabetes mellitus without complications: Secondary | ICD-10-CM | POA: Diagnosis not present

## 2019-04-23 ENCOUNTER — Other Ambulatory Visit
Admission: RE | Admit: 2019-04-23 | Discharge: 2019-04-23 | Disposition: A | Discharge status: Home / Self Care | Payer: BC Managed Care – PPO | Attending: Internal Medicine | Admitting: Internal Medicine

## 2019-04-23 ENCOUNTER — Other Ambulatory Visit: Payer: Self-pay

## 2019-04-24 ENCOUNTER — Other Ambulatory Visit
Admission: RE | Admit: 2019-04-24 | Discharge: 2019-04-24 | Disposition: A | Discharge status: Home / Self Care | Payer: BC Managed Care – PPO | Source: Ambulatory Visit | Attending: Internal Medicine | Admitting: Internal Medicine

## 2019-04-25 DIAGNOSIS — E669 Obesity, unspecified: Secondary | ICD-10-CM | POA: Diagnosis not present

## 2019-04-25 DIAGNOSIS — E119 Type 2 diabetes mellitus without complications: Secondary | ICD-10-CM | POA: Diagnosis not present

## 2019-04-25 DIAGNOSIS — I10 Essential (primary) hypertension: Secondary | ICD-10-CM | POA: Diagnosis not present

## 2019-04-25 DIAGNOSIS — E781 Pure hyperglyceridemia: Secondary | ICD-10-CM | POA: Diagnosis not present

## 2019-04-28 LAB — MICROALBUMIN, URINE: Microalb, Ur: 3.9 ug/mL — ABNORMAL HIGH

## 2019-04-28 LAB — HEMOGLOBIN A1C
Hgb A1c MFr Bld: 5.9 % — ABNORMAL HIGH (ref 4.8–5.6)
Mean Plasma Glucose: 123 mg/dL

## 2019-05-02 DIAGNOSIS — E119 Type 2 diabetes mellitus without complications: Secondary | ICD-10-CM | POA: Diagnosis not present

## 2019-05-23 ENCOUNTER — Other Ambulatory Visit: Payer: Self-pay

## 2019-05-23 DIAGNOSIS — Z20828 Contact with and (suspected) exposure to other viral communicable diseases: Secondary | ICD-10-CM | POA: Diagnosis not present

## 2019-05-23 DIAGNOSIS — Z20822 Contact with and (suspected) exposure to covid-19: Secondary | ICD-10-CM

## 2019-05-25 LAB — NOVEL CORONAVIRUS, NAA: SARS-CoV-2, NAA: NOT DETECTED

## 2019-08-01 DIAGNOSIS — I10 Essential (primary) hypertension: Secondary | ICD-10-CM | POA: Diagnosis not present

## 2019-08-01 DIAGNOSIS — E669 Obesity, unspecified: Secondary | ICD-10-CM | POA: Diagnosis not present

## 2019-08-01 DIAGNOSIS — E119 Type 2 diabetes mellitus without complications: Secondary | ICD-10-CM | POA: Diagnosis not present

## 2019-08-01 DIAGNOSIS — E781 Pure hyperglyceridemia: Secondary | ICD-10-CM | POA: Diagnosis not present

## 2019-08-04 DIAGNOSIS — E034 Atrophy of thyroid (acquired): Secondary | ICD-10-CM | POA: Diagnosis not present

## 2019-08-04 DIAGNOSIS — E119 Type 2 diabetes mellitus without complications: Secondary | ICD-10-CM | POA: Diagnosis not present

## 2019-08-04 DIAGNOSIS — E7849 Other hyperlipidemia: Secondary | ICD-10-CM | POA: Diagnosis not present

## 2019-08-04 DIAGNOSIS — I10 Essential (primary) hypertension: Secondary | ICD-10-CM | POA: Diagnosis not present

## 2019-08-22 DIAGNOSIS — I10 Essential (primary) hypertension: Secondary | ICD-10-CM | POA: Diagnosis not present

## 2019-08-22 DIAGNOSIS — G4700 Insomnia, unspecified: Secondary | ICD-10-CM | POA: Diagnosis not present

## 2019-08-22 DIAGNOSIS — E119 Type 2 diabetes mellitus without complications: Secondary | ICD-10-CM | POA: Diagnosis not present

## 2019-08-22 DIAGNOSIS — E669 Obesity, unspecified: Secondary | ICD-10-CM | POA: Diagnosis not present

## 2019-09-19 DIAGNOSIS — N3091 Cystitis, unspecified with hematuria: Secondary | ICD-10-CM | POA: Diagnosis not present

## 2019-09-19 DIAGNOSIS — E119 Type 2 diabetes mellitus without complications: Secondary | ICD-10-CM | POA: Diagnosis not present

## 2019-09-19 DIAGNOSIS — E669 Obesity, unspecified: Secondary | ICD-10-CM | POA: Diagnosis not present

## 2019-09-19 DIAGNOSIS — I10 Essential (primary) hypertension: Secondary | ICD-10-CM | POA: Diagnosis not present

## 2019-09-26 DIAGNOSIS — K5909 Other constipation: Secondary | ICD-10-CM | POA: Diagnosis not present

## 2019-09-26 DIAGNOSIS — E119 Type 2 diabetes mellitus without complications: Secondary | ICD-10-CM | POA: Diagnosis not present

## 2019-09-26 DIAGNOSIS — E669 Obesity, unspecified: Secondary | ICD-10-CM | POA: Diagnosis not present

## 2019-09-26 DIAGNOSIS — I10 Essential (primary) hypertension: Secondary | ICD-10-CM | POA: Diagnosis not present

## 2019-10-24 DIAGNOSIS — L299 Pruritus, unspecified: Secondary | ICD-10-CM | POA: Diagnosis not present

## 2019-10-24 DIAGNOSIS — E669 Obesity, unspecified: Secondary | ICD-10-CM | POA: Diagnosis not present

## 2019-10-24 DIAGNOSIS — I10 Essential (primary) hypertension: Secondary | ICD-10-CM | POA: Diagnosis not present

## 2019-10-24 DIAGNOSIS — E119 Type 2 diabetes mellitus without complications: Secondary | ICD-10-CM | POA: Diagnosis not present

## 2019-10-24 DIAGNOSIS — Z0000 Encounter for general adult medical examination without abnormal findings: Secondary | ICD-10-CM | POA: Diagnosis not present

## 2019-10-27 ENCOUNTER — Other Ambulatory Visit: Payer: Self-pay | Admitting: Internal Medicine

## 2019-10-27 DIAGNOSIS — Z0000 Encounter for general adult medical examination without abnormal findings: Secondary | ICD-10-CM | POA: Diagnosis not present

## 2019-10-27 DIAGNOSIS — Z1231 Encounter for screening mammogram for malignant neoplasm of breast: Secondary | ICD-10-CM

## 2019-10-31 DIAGNOSIS — I10 Essential (primary) hypertension: Secondary | ICD-10-CM | POA: Diagnosis not present

## 2019-10-31 DIAGNOSIS — Z8639 Personal history of other endocrine, nutritional and metabolic disease: Secondary | ICD-10-CM | POA: Diagnosis not present

## 2019-10-31 DIAGNOSIS — E669 Obesity, unspecified: Secondary | ICD-10-CM | POA: Diagnosis not present

## 2019-10-31 DIAGNOSIS — L299 Pruritus, unspecified: Secondary | ICD-10-CM | POA: Diagnosis not present

## 2019-11-10 ENCOUNTER — Ambulatory Visit
Admission: RE | Admit: 2019-11-10 | Discharge: 2019-11-10 | Disposition: A | Discharge status: Home / Self Care | Payer: BC Managed Care – PPO | Source: Ambulatory Visit | Attending: Internal Medicine | Admitting: Internal Medicine

## 2019-11-10 DIAGNOSIS — Z1231 Encounter for screening mammogram for malignant neoplasm of breast: Secondary | ICD-10-CM | POA: Diagnosis not present

## 2020-01-30 ENCOUNTER — Ambulatory Visit: Payer: BC Managed Care – PPO | Admitting: Internal Medicine

## 2020-02-05 NOTE — Progress Notes (Signed)
Established Patient Office Visit  SUBJECTIVE:  Subjective  Patient ID: Cindy Shah, female    DOB: Aug 06, 1961  Age: 59 y.o. MRN: [id number]  CC:  Chief Complaint  Patient presents with  . Diabetes    3 month follow up    HPI Cindy Shah is a 59 y.o. female presenting today for for a three month check up for her diabetes. She is here today with her daughter.   She notes that right now she is not taking any medication; she states that last time she was here, she was told that her blood sugar and blood pressure were stable and that she could be taken off medication. Her bloods sugar this morning was 136.   She is vaccinated against COVID-19   Past Medical History:  Diagnosis Date  . Benign neoplasm of breast    Right  . Benign neoplasm of left breast 09/04/2014  . Diabetes mellitus without complication (HCC)    NIDDM  . Fibroadenoma of breast 02/03/2013    Past Surgical History:  Procedure Laterality Date  . BREAST EXCISIONAL BIOPSY Left 02-11-14   fibroadenoma  . BREAST EXCISIONAL BIOPSY Right 2010   benign  . BREAST SURGERY Right 2010   Mass excision  . CESAREAN SECTION  1999  . CHEST SURGERY  2002  . CHOLECYSTECTOMY  1995  . FINGER SURGERY  1995    Family History  Problem Relation Age of Onset  . Breast cancer Neg Hx   . Colon cancer Neg Hx     Social History   Socioeconomic History  . Marital status: Married    Spouse name: Not on file  . Number of children: Not on file  . Years of education: Not on file  . Highest education level: Not on file  Occupational History  . Not on file  Tobacco Use  . Smoking status: Never Smoker  . Smokeless tobacco: Never Used  Substance and Sexual Activity  . Alcohol use: No  . Drug use: No  . Sexual activity: Not on file  Other Topics Concern  . Not on file  Social History Narrative  . Not on file   Social Determinants of Health   Financial Resource Strain:   . Difficulty of Paying Living  Expenses:   Food Insecurity:   . Worried About Charity fundraiser in the Last Year:   . Arboriculturist in the Last Year:   Transportation Needs:   . Film/video editor (Medical):   Marland Kitchen Lack of Transportation (Non-Medical):   Physical Activity:   . Days of Exercise per Week:   . Minutes of Exercise per Session:   Stress:   . Feeling of Stress :   Social Connections:   . Frequency of Communication with Friends and Family:   . Frequency of Social Gatherings with Friends and Family:   . Attends Religious Services:   . Active Member of Clubs or Organizations:   . Attends Archivist Meetings:   Marland Kitchen Marital Status:   Intimate Partner Violence:   . Fear of Current or Ex-Partner:   . Emotionally Abused:   Marland Kitchen Physically Abused:   . Sexually Abused:     No current outpatient medications on file.   No Known Allergies  ROS Review of Systems  Constitutional: Negative.   HENT: Negative.   Eyes: Negative.   Respiratory: Negative.   Cardiovascular: Negative.   Gastrointestinal: Negative.   Endocrine: Negative.   Genitourinary: Negative.  Musculoskeletal: Negative.  Negative for arthralgias.  Skin: Negative.   Allergic/Immunologic: Negative.   Neurological: Negative.   Hematological: Negative.   Psychiatric/Behavioral: Negative.   All other systems reviewed and are negative.    OBJECTIVE:    Physical Exam Vitals reviewed.  Constitutional:      Appearance: Normal appearance. She is obese.  HENT:     Mouth/Throat:     Mouth: Mucous membranes are moist.  Eyes:     Pupils: Pupils are equal, round, and reactive to light.  Cardiovascular:     Rate and Rhythm: Normal rate and regular rhythm.     Pulses: Normal pulses.     Heart sounds: Normal heart sounds.  Pulmonary:     Effort: Pulmonary effort is normal.     Breath sounds: Normal breath sounds.  Abdominal:     Palpations: There is no hepatomegaly, splenomegaly or mass.     Tenderness: There is no abdominal  tenderness.  Musculoskeletal:     Right lower leg: No edema.     Left lower leg: No edema.  Neurological:     Mental Status: She is alert and oriented to person, place, and time.  Psychiatric:        Mood and Affect: Mood and affect normal.        Behavior: Behavior normal.     BP 139/75   Pulse 82   Wt 162 lb 8 oz (73.7 kg)   BMI 35.16 kg/m  Wt Readings from Last 3 Encounters:  02/06/20 162 lb 8 oz (73.7 kg)  01/27/19 167 lb 12.8 oz (76.1 kg)  10/22/18 174 lb 6.4 oz (79.1 kg)    Health Maintenance Due  Topic Date Due  . Hepatitis C Screening  Never done  . PNEUMOCOCCAL POLYSACCHARIDE VACCINE AGE 1-64 HIGH RISK  Never done  . FOOT EXAM  Never done  . OPHTHALMOLOGY EXAM  Never done  . COVID-19 Vaccine (1) Never done  . HIV Screening  Never done  . TETANUS/TDAP  Never done  . PAP SMEAR-Modifier  Never done  . COLONOSCOPY  Never done  . HEMOGLOBIN A1C  10/22/2019    There are no preventive care reminders to display for this patient.  No flowsheet data found. No flowsheet data found.  No results found for: TSH No results found for: ALBUMIN, ANIONGAP, EGFR, GFR No results found for: CHOL, HDL, LDLCALC, CHOLHDL No results found for: TRIG Lab Results  Component Value Date   HGBA1C 5.9 (H) 04/24/2019      ASSESSMENT & PLAN:   Problem List Items Addressed This Visit      Cardiovascular and Mediastinum   Essential hypertension    - Today, the patient's blood pressure is well managed onARB. - The patient will continue the current treatment regimen.  - I encouraged the patient to eat a low-sodium diet to help control blood pressure. - I encouraged the patient to live an active lifestyle and complete activities that increases heart rate to 85% target heart rate at least 5 times per week for one hour.           Endocrine   Other specified diabetes mellitus with other specified complication ([place]) - Primary    - The patient's blood sugar is under control on  METFORMIN. - The patient will continue the current treatment regimen.  - I encouraged the patient to regularly check blood sugar.  - I encouraged the patient to monitor diet. I encouraged the patient to eat low-carb and  low-sugar to help prevent blood sugar spikes.  - I encouraged the patient to continue following their prescribed treatment plan for diabetes - I informed the patient to get help if blood sugar drops below 106m/dL, or if suddenly have trouble thinking clearly or breathing.           Other   Class 2 obesity due to excess calories with body mass index (BMI) of 35.0 to 35.9 in adult    - I encouraged the patient to lose weight.  - I educated them on making healthy dietary choices including eating more fruits and vegetables and less fried foods. - I encouraged the patient to exercise more, and educated on the benefits of exercise including weight loss, diabetes management, and hypertension management.          No orders of the defined types were placed in this encounter.    Follow-up: Return in about 3 months (around 05/08/2020) for Diabetes-Medication Check.    Dr. JJane CanaryRPeak Behavioral Health Services[address] [place] [place] [id number]  By signing my name below, I, [employer] attest that this documentation has been prepared under the direction and in the presence of MCletis Athens MD. Electronically Signed: JCletis Athens MD 02/06/20, 10:10 AM  I personally performed the services described in this documentation, which was SCRIBED in my presence. The recorded information has been reviewed and considered accurate. It has been edited as necessary during review. JCletis Athens MD

## 2020-02-06 ENCOUNTER — Other Ambulatory Visit: Payer: Self-pay

## 2020-02-06 ENCOUNTER — Ambulatory Visit (INDEPENDENT_AMBULATORY_CARE_PROVIDER_SITE_OTHER): Payer: BC Managed Care – PPO | Admitting: Internal Medicine

## 2020-02-06 ENCOUNTER — Encounter: Payer: Self-pay | Admitting: Internal Medicine

## 2020-02-06 VITALS — BP 139/75 | HR 82 | Wt 162.5 lb

## 2020-02-06 DIAGNOSIS — Z6835 Body mass index (BMI) 35.0-35.9, adult: Secondary | ICD-10-CM | POA: Insufficient documentation

## 2020-02-06 DIAGNOSIS — I10 Essential (primary) hypertension: Secondary | ICD-10-CM | POA: Diagnosis not present

## 2020-02-06 DIAGNOSIS — E6609 Other obesity due to excess calories: Secondary | ICD-10-CM

## 2020-02-06 DIAGNOSIS — E119 Type 2 diabetes mellitus without complications: Secondary | ICD-10-CM | POA: Diagnosis not present

## 2020-02-06 DIAGNOSIS — E1369 Other specified diabetes mellitus with other specified complication: Secondary | ICD-10-CM | POA: Insufficient documentation

## 2020-02-06 LAB — GLUCOSE, POCT (MANUAL RESULT ENTRY): POC Glucose: 136 mg/dl — AB (ref 70–99)

## 2020-02-06 NOTE — Assessment & Plan Note (Signed)
-   I encouraged the patient to lose weight.  - I educated them on making healthy dietary choices including eating more fruits and vegetables and less fried foods. - I encouraged the patient to exercise more, and educated on the benefits of exercise including weight loss, diabetes management, and hypertension management.   

## 2020-02-06 NOTE — Assessment & Plan Note (Signed)
-   The patient's blood sugar is under control on METFORMIN. - The patient will continue the current treatment regimen.  - I encouraged the patient to regularly check blood sugar.  - I encouraged the patient to monitor diet. I encouraged the patient to eat low-carb and low-sugar to help prevent blood sugar spikes.  - I encouraged the patient to continue following their prescribed treatment plan for diabetes - I informed the patient to get help if blood sugar drops below 54mg /dL, or if suddenly have trouble thinking clearly or breathing.

## 2020-02-06 NOTE — Assessment & Plan Note (Signed)
-   Today, the patient's blood pressure is well managed on ARB. - The patient will continue the current treatment regimen.  - I encouraged the patient to eat a low-sodium diet to help control blood pressure. - I encouraged the patient to live an active lifestyle and complete activities that increases heart rate to 85% target heart rate at least 5 times per week for one hour.     

## 2020-02-20 ENCOUNTER — Other Ambulatory Visit: Payer: Self-pay | Admitting: Internal Medicine

## 2020-05-10 ENCOUNTER — Ambulatory Visit: Payer: BC Managed Care – PPO | Admitting: Internal Medicine

## 2020-05-18 ENCOUNTER — Ambulatory Visit (INDEPENDENT_AMBULATORY_CARE_PROVIDER_SITE_OTHER): Payer: BC Managed Care – PPO | Admitting: Internal Medicine

## 2020-05-18 ENCOUNTER — Other Ambulatory Visit: Payer: Self-pay

## 2020-05-18 ENCOUNTER — Encounter: Payer: Self-pay | Admitting: Internal Medicine

## 2020-05-18 VITALS — BP 150/77 | HR 77 | Ht <= 58 in | Wt 166.0 lb

## 2020-05-18 DIAGNOSIS — E789 Disorder of lipoprotein metabolism, unspecified: Secondary | ICD-10-CM

## 2020-05-18 DIAGNOSIS — E1369 Other specified diabetes mellitus with other specified complication: Secondary | ICD-10-CM | POA: Diagnosis not present

## 2020-05-18 DIAGNOSIS — Z23 Encounter for immunization: Secondary | ICD-10-CM

## 2020-05-18 DIAGNOSIS — E6609 Other obesity due to excess calories: Secondary | ICD-10-CM

## 2020-05-18 DIAGNOSIS — Z6835 Body mass index (BMI) 35.0-35.9, adult: Secondary | ICD-10-CM

## 2020-05-18 LAB — GLUCOSE, POCT (MANUAL RESULT ENTRY): POC Glucose: 120 mg/dl — AB (ref 70–99)

## 2020-05-18 MED ORDER — LISINOPRIL 10 MG PO TABS: 10.0000 mg | ORAL_TABLET | Freq: Every day | ORAL | 3 refills | Status: DC

## 2020-05-18 NOTE — Assessment & Plan Note (Signed)
-   The patient's blood sugar is under control on glipizide. - The patient will continue the current treatment regimen.  - I encouraged the patient to regularly check blood sugar.  - I encouraged the patient to monitor diet. I encouraged the patient to eat low-carb and low-sugar to help prevent blood sugar spikes.  - I encouraged the patient to continue following their prescribed treatment plan for diabetes - I informed the patient to get help if blood sugar drops below 54mg /dL, or if suddenly have trouble thinking clearly or breathing.

## 2020-05-18 NOTE — Patient Instructions (Addendum)
Hacer ejercicio para bajar de peso Exercising to [employer]  El ejercicio es la actividad fsica estructurada y repetitiva que se realiza para mejorar el [place] fsico y Technical sales engineer. Hacer ejercicio de forma regular es importante para todos. Es especialmente importante si tiene sobrepeso. El sobrepeso aumenta el riesgo de tener enfermedad cardaca, accidente cerebrovascular, diabetes, presin arterial alta y varios tipos de cncer. Reducir la ingesta de caloras y hacer ejercicio pueden ayudarlo a bajar de [place]. El ejercicio por lo general se clasifica como de intensidad moderada o vigorosa. Para bajar de peso, la [employer] de las personas debe hacer una determinada cantidad de ejercicio de intensidad moderada o vigorosa cada semana.  Ejercicio de intensidad moderada  El ejercicio de intensidad moderada es cualquier actividad que lo haga moverse lo suficiente como para quemar al menos tres veces ms energa (caloras) que si estuviera sentado. Algunos ejemplos de ejercicio de intensidad moderada son:  Caminar una milla (1,6 kilmetros) en 15 minutos.  Hacer trabajos de jardinera livianos.  Andar en bicicleta a un ritmo fcil de aguantar. La [employer] de las personas debe hacer al menos 150 minutos (2 horas y 30 minutos) por semana de ejercicio de intensidad moderada para Theatre manager su Engineer, site.  Ejercicio de intensidad vigorosa El ejercicio de intensidad vigorosa es cualquier actividad que lo haga moverse lo suficiente como para quemar al menos seis veces ms caloras que si estuviera sentado. Al hacer ejercicio a esta intensidad, su nivel de esfuerzo debera ser lo suficientemente alto como para no permitirle Programmer, systems. Algunos ejemplos de ejercicio de intensidad vigorosa son:  Optometrist.  Practicar un deporte de equipo, como ftbol americano, baloncesto y ftbol.  Saltar la cuerda. La [employer] de las personas debe hacer al menos 75 minutos (1 hora y 15 minutos) por semana de  ejercicio de intensidad vigorosa para mantener su Engineer, site.  Cmo puede afectarme el ejercicio? Cuando hace suficiente ejercicio como para quemar ms caloras que las que consume, pierde [place]. Tambin reduce la grasa corporal y aumenta la masa muscular. Cuanto ms msculo tenga, mayor cantidad de Nurse, children's. El ejercicio tambin:  Mejora el estado de nimo.  Reduce el estrs y las tensiones.  Mejora el estado fsico general, la flexibilidad y la resistencia.  Aumenta la fuerza sea. La cantidad de ejercicio que necesita realizar para bajar de peso depende de:  Su edad.  El tipo de ejercicio.  Cualquier afeccin de [employer].  Su capacidad fsica general. Pregntele al mdico cunto ejercicio debe realizar y qu tipos de actividades son seguras para usted.  Qu medidas puedo tomar para bajar de peso? Nutricin  1. Haga cambios en la dieta como se lo haya indicado el mdico o especialista en alimentacin y nutricin (nutricionista). Esto puede incluir lo siguiente: ? Consumir menos caloras. ? Consumir ms protenas. ? Consumir menos grasas no saludables. ? Seguir una dieta que incluya frutas y verduras frescas, cereales integrales, productos lcteos semidescremados y Advertising account planner. ? Evite los alimentos con grasa, sal y azcar agregadas. 2. Beba gran cantidad de agua mientras hace ejercicio para evitar la deshidratacin o los golpes de Freight forwarder.  Actividad 1. Elija una actividad que disfrute y establezca objetivos realistas. El mdico puede ayudarlo a Paediatric nurse un plan de ejercicio que funcione para usted. 2. Sherilyn Cooter ejercicio a una intensidad moderada o vigorosa la [employer] de la [place]. ? La intensidad de la actividad fsica puede variar de Ardelia Mems persona a Theatre manager. Puede saber qu tan intensa Ardelia Mems  rutina de ejercicios es para usted al Sales promotion account executive atencin a su respiracin y latidos cardacos. La [employer] de las personas notar que su respiracin y latidos  cardacos se aceleran al Optometrist ejercicio de mayor intensidad. [place] veces por semana, como: ? Flexiones de [employer]. ? Abdominales. ? Levantamiento de pesas. ? Uso de bandas elsticas de resistencia. 4. Hacer ejercicio en perodos cortos de [employer] ser tan til [employer] perodos largos y estructurados de ejercicio. Si tiene dificultad para encontrar tiempo para Engineer, site, trate de incluir el ejercicio en su rutina diaria. ? Levntese, estrese y camine cada 30 minutos a lo largo del Training and development officer. ? Vaya a caminar durante su hora de almuerzo. ? Estacione el auto lejos de su lugar de destino. ? Si [place] transporte pblico, bjese una parada antes y camine el resto del camino. ? Pngase de pie y camine cada vez que hable por telfono. ? Utilice la Writer del ascensor o la Civil engineer, contracting. 5. Use ropa cmoda y calzado con buen soporte. 6. No haga ejercicio en exceso que pudiera hacer que se lastime, se sienta mareado o tenga dificultad para respirar.  Dnde buscar ms informacin  Departamento de Salud y Servicios Humanos de los Estados Unidos (U.S. Department of Health and [employer]): [email or web address]  Centros para el Control y la Prevencin de Probation officer for Disease Control and Prevention, CDC): http://www.wolf.info/  Comunquese con un mdico:  Antes de comenzar un nuevo programa de ejercicios.  Si tiene preguntas o inquietudes acerca de su peso.  Si tiene un problema mdico que Producer, television/film/video.  Obtenga ayuda de inmediato si presenta alguno de los siguientes problemas al hacer ejercicio:  Lesiones.  Mareos.  Dificultad para respirar o falta de aire que no desaparecen al dejar de hacer ejercicio.  Dolor en el pecho.  Latidos cardacos rpidos.  Resumen  El sobrepeso aumenta el riesgo de tener enfermedad cardaca, accidente cerebrovascular, diabetes, presin arterial alta y varios tipos de cncer.  Para perder  peso debe quemar ms caloras que las que consume.  Reducir la cantidad de caloras que consume, adems de hacer ejercicio de intensidad moderada o vigorosa todas las [place], [place] a Administrator, Civil Service.  Esta informacin no tiene Marine scientist el consejo del mdico. Asegrese de hacerle al mdico cualquier pregunta que tenga. Document Revised: 09/28/2017 Document Reviewed: 09/28/2017 Elsevier Patient Education  [place].  --------------------------------------------------------------------------------------  Recuento de caloras para bajar de peso Calorie Counting for [employer] Loss  Las caloras son unidades de Teacher, early years/pre. Su cuerpo necesita una cierta cantidad de caloras de los alimentos para que le ayuden a Company secretary. Cuando come ms caloras de las que el cuerpo necesita, este acumula las caloras extra [place]. Cuando come [employer] de las que el cuerpo [place], este quema grasa para obtener la energa que requiere. El recuento de caloras es el registro de la cantidad de caloras que come y Pharmacologist. El recuento de caloras puede ser de ayuda si necesita perder peso. Si se asegura de comer menos caloras de las que el cuerpo necesita, debe bajar de [place]. Pregntele al mdico cul es un peso sano para usted. Para que el recuento de caloras funcione, tendr que comer la cantidad de caloras adecuadas para usted en un da, para bajar una cantidad de peso saludable por semana. Un nutricionista puede determinar la cantidad de caloras que necesita por da y sugerirle cmo alcanzar su objetivo calrico.  Ardelia Mems  cantidad de peso saludable para bajar por semana suele ser entre 1 y 2 libras (0,5 a 0,9 kg). Esto significa con frecuencia que su ingesta diaria de caloras se debera reducir unas 500 a 750 caloras.  Ingerir 1200 a 1500 caloras por Administrator, Civil Service a la [place] a Administrator, Civil Service.  Ingerir de 1500 a 1800 caloras por Administrator, Civil Service a la [employer] de los  hombres a Administrator, Civil Service.  En qu consiste el plan? Mi objetivo es comer __________ Raenette Rover da. Si como esta cantidad de caloras por da, debo bajar unas __________ Terrall Laity.  Qu debo saber acerca del recuento de caloras? A fin de alcanzar su objetivo diario de caloras, tendr que:  Averiguar cuntas caloras hay en cada alimento que le Therapist, occupational. Intente hacerlo antes de comer.  Decida la cantidad que puede comer del alimento.  Anote lo que comi y cuntas caloras tena. Esta tarea se conoce como llevar un registro de comidas. Para perder peso con xito es importante equilibrar el recuento de caloras con un estilo de vida saludable que incluya actividad fsica de forma regular. Tenga un objetivo de 150 minutos de ejercicio moderado (como caminar) o 75 minutos de ejercicio vigoroso (como correr) todas las [employer] .  Dnde encuentro informacin sobre las caloras?  Es posible Animator cantidad de caloras que contiene un alimento en la etiqueta de informacin nutricional. Si un alimento no tiene una etiqueta de informacin nutricional, intente buscar las caloras en Internet o pida ayuda al nutricionista. Recuerde que las caloras se calculan por porcin. Si opta por comer ms de una porcin de un alimento, tendr [employer] las caloras por porcin por la cantidad de porciones que planea comer. Por ejemplo, la etiqueta de un envase de pan puede decir que el tamao de una porcin es 1 rodaja, y que una porcin tiene 90 caloras. Si come 1 rodaja, habr comido 90 caloras. Si come 2 rodajas, habr comido 180 caloras.  Cmo llevo un registro de comidas? Despus de cada comida, registre la siguiente informacin en el registro de comidas:  Lo que comi. No olvide incluir los aderezos, las salsas y otros extras de la comida.  La cantidad que comi. Esto se puede medir en tazas, onzas o cantidad de alimentos.  Cuntas caloras ingiri por comida y por  bebida.  La cantidad total de caloras en la comida. Tenga a Materials engineer de comidas, por ejemplo, en un anotador de bolsillo o utilice una aplicacin mvil o sitio web. Algunos programas calcularn las caloras y Automotive engineer la cantidad de caloras que le quedan para llegar al objetivo diario.  Cules son algunos consejos para el recuento de caloras?  3. Use las caloras de los alimentos y las bebidas que lo sacien y no lo dejen con apetito: ? Algunos ejemplos de alimentos que lo sacian son los frutos secos y Engineer, mining de frutos secos, verduras, Advertising account planner y Clinical research associate con alto contenido de Pharmacist, hospital como los cereales integrales. Los alimentos con alto contenido de [place] son aquellos que tienen ms de 5 g de fibra por porcin. ? Las [employer] refrescos, especialmente las bebidas a base de caf y los jugos, que contienen muchas caloras, pero no le dan saciedad. 4. Coma alimentos nutritivos y evite las caloras vacas. Las caloras vacas son aquellas que se obtienen de los alimentos o las bebidas que no contienen muchos nutrientes ni protenas, como los dulces y los refrescos. Es mejor comer [place]  comida nutritiva altamente calrica (como un aguacate) que una con pocos nutrientes (como una bolsa de patatas fritas). 5. Sepa cuntas caloras tienen los alimentos que come con ms frecuencia. Esto le ayudar a contar las caloras ms rpidamente. 6. Preste atencin a las [employer]. Las bebidas de bajas caloras incluyen agua y refrescos sin Location manager. 7. Preste atencin a las etiquetas nutricionales de alimentos "bajos en grasas" o "sin grasas". Estos alimentos a veces tienen la misma cantidad de caloras o ms caloras que las versiones ricas en grasa. Con frecuencia, tambin tienen agregados de azcar, almidn o sal, para darles el sabor que fue eliminado con la grasa. 8. Encuentre un mtodo para controlar las caloras que funcione para usted. Sea creativo. Pruebe aplicaciones  o programas distintos, si llevar un registro de las caloras no funciona para usted.  Cules son algunos consejos para controlar las porciones?  Sepa cuntas caloras hay en una porcin. Esto lo ayudar a saber cuntas porciones de un alimento determinado puede comer.  Use una taza medidora para medir los tamaos de las porciones. Tambin Secondary school teacher las porciones en una balanza de cocina. Con el tiempo, podr hacer un clculo estimativo de los tamaos de las porciones de algunos alimentos.  Dedique tiempo a poner porciones de diferentes alimentos en sus platos, tazones y tazas predilectos, a fin de saber cmo se ve una porcin.  Intente no comer directamente de una bolsa o una caja. Esto puede llevarlo a comer en exceso. Ponga la cantidad [employer] gustara comer en una taza o un plato, a fin de asegurarse de que est comiendo la porcin correcta.  Use platos, vasos y tazones ms pequeos para no comer en exceso.  Intente no realizar varias tareas al [employer] (como mirar la TV o usar su computadora) [place] come. Si es la hora de comer, sintese a Conservation officer, nature y disfrute de Environmental education officer. Esto lo ayudar a Marine scientist cundo est satisfecho. Tambin le ayudar a tomar conciencia de lo que est comiendo y de la cantidad.  Cules son algunos consejos para seguir este plan? Lectura de las etiquetas de los alimentos  Controle el recuento de caloras en comparacin con el tamao de la porcin. El tamao de la porcin puede ser ms pequeo de lo que suele comer.  Verifique la fuente de las caloras. Asegrese de que la comida que ingiere tenga alto contenido de vitaminas y protenas y sea baja en grasas saturadas y grasas trans.  De compras  Lea las etiquetas nutricionales cuando compre. Esto le ayudar a tomar decisiones ms saludables antes de comprar [employer].  Haga una lista para el almacn y resptela.  La coccin  Intente cocinar sus alimentos preferidos de una manera ms  saludable. Por ejemplo, pruebe hornear en vez de frer.  Utilice productos lcteos descremados.  Planificacin de los alimentos  Utilice ms frutas y verduras. La mitad de sus platos debe ser de frutas y verduras.  Incluya protenas [employer] y el pescado.  Cmo puedo hacer el recuento de caloras cuando como afuera? 7. Pida porciones ms pequeas. 8. Considere la posibilidad de compartir un plato principal y las guarniciones, en lugar de pedir su propio plato principal. 9. Si pide su propio plato principal, coma solo la mitad. Pida una caja al comienzo de la comida y ponga all el resto del plato principal, para no sentir la tentacin de comerlo. 10. Si se detallan las caloras en el men, elija las opciones que  contengan la menor cantidad. 11. Elija platos que incluyan verduras, frutas, cereales integrales, productos lcteos con bajo contenido de grasa y [employer]. 12. Opte por los alimentos hervidos, asados, cocidos a la parrilla o al vapor. No coma alimentos que contengan mantequilla, estn empanados, fritos o que se sirvan con salsa a base de crema. Generalmente, los alimentos que se etiquetan como "crujientes" estn fritos, a menos que se indique lo contrario. [place] agua, la [place], [place] t helado sin azcar u otras bebidas que no contengan azcares agregados. Si desea una bebida alcohlica, escoja una opcin con menos caloras como una copa de vino o una cerveza ligera. 60. Ordene los aderezos, las salsas y los jarabes aparte. Estos son, con frecuencia, de alto contenido en caloras, por lo que debe limitar la cantidad que ingiere. 15. Si desea una ensalada, elija una de hortalizas y pida carnes a la parrilla. Evite las guarniciones adicionales como el tocino, el queso o los alimentos fritos. Ordene el aderezo aparte o pida aceite de [place] y vinagre o limn para Haematologist. 89. Haga un clculo estimativo de la cantidad de porciones que le sirven. Por  ejemplo, una porcin de arroz cocido equivale a media taza o la mitad del tamao de una pelota de bisbol. Conocer el tamao de las porciones lo ayudar a Personnel officer atento a la cantidad de comida que come [employer]. La lista que sigue le [place] el tamao de algunas porciones comunes a partir de objetos cotidianos: ? 1 onza (28 g) = 4 dados apilados. ? 3 onzas (85 g) = 1 mazo de cartas. ? 1 cucharadita = 1 dado. ? 1 cucharada = media pelota de tenis de mesa. ? 2 cucharadas = 1 pelota de tenis de mesa. ? Media taza = media pelota de bisbol. ? 1 taza = 1 pelota de bisbol.  Resumen  El recuento de caloras es el registro de la cantidad de caloras que come y Pharmacologist. Si come menos caloras de las que el cuerpo necesita, debe bajar de [place].  Una cantidad de peso saludable para bajar por semana suele ser entre 1 y 2 libras (0,5 a 0,9 kg). Esto significa, con frecuencia, reducir su ingesta diaria de caloras unas 500 a 750 caloras.  Es posible Animator cantidad de caloras que contiene un alimento en la etiqueta de informacin nutricional. Si un alimento no tiene una etiqueta de informacin nutricional, intente buscar las caloras en Internet o pida ayuda al nutricionista.  Use las caloras de los alimentos y las bebidas que lo sacien y no de los alimentos y las bebidas que lo dejan con apetito.  Use platos, vasos y tazones ms pequeos para no comer en exceso.  Esta informacin no tiene Marine scientist el consejo del mdico. Asegrese de hacerle al mdico cualquier pregunta que tenga. Document Revised: 10/30/2016 Document Reviewed: 10/30/2016 Elsevier Patient Education  [place].

## 2020-05-18 NOTE — Addendum Note (Signed)
Addended by: Anson [place] R on: 05/18/2020 10:55 AM   Modules accepted: Orders

## 2020-05-18 NOTE — Progress Notes (Signed)
Established Patient Office Visit  SUBJECTIVE:  Subjective  Patient ID: Cindy Shah, female    DOB: 19-Jan-1961  Age: 59 y.o. MRN: [id number]  CC:  Chief Complaint  Patient presents with  . Diabetes    3 month follow up     HPI Cindy Shah is a 59 y.o. female presenting today for a diabetes follow up.   Her blood sugar today is 120. Her blood pressure is 150/77. She feels good today. She is taking her medication as directed and without any difficulty. She denies any missed doses.   She is vaccinated against COVID19. She is not currently vaccinated against influenza, and she would like one today.    Past Medical History:  Diagnosis Date  . Benign neoplasm of breast    Right  . Benign neoplasm of left breast 09/04/2014  . Diabetes mellitus without complication (HCC)    NIDDM  . Fibroadenoma of breast 02/03/2013    Past Surgical History:  Procedure Laterality Date  . BREAST EXCISIONAL BIOPSY Left 02-11-14   fibroadenoma  . BREAST EXCISIONAL BIOPSY Right 2010   benign  . BREAST SURGERY Right 2010   Mass excision  . CESAREAN SECTION  1999  . CHEST SURGERY  2002  . CHOLECYSTECTOMY  1995  . FINGER SURGERY  1995    Family History  Problem Relation Age of Onset  . Breast cancer Neg Hx   . Colon cancer Neg Hx     Social History   Socioeconomic History  . Marital status: Married    Spouse name: Not on file  . Number of children: Not on file  . Years of education: Not on file  . Highest education level: Not on file  Occupational History  . Not on file  Tobacco Use  . Smoking status: Never Smoker  . Smokeless tobacco: Never Used  Substance and Sexual Activity  . Alcohol use: No  . Drug use: No  . Sexual activity: Not on file  Other Topics Concern  . Not on file  Social History Narrative  . Not on file   Social Determinants of Health   Financial Resource Strain:   . Difficulty of Paying Living Expenses: Not on file  Food Insecurity:   .  Worried About Charity fundraiser in the Last Year: Not on file  . Ran Out of Food in the Last Year: Not on file  Transportation Needs:   . Lack of Transportation (Medical): Not on file  . Lack of Transportation (Non-Medical): Not on file  Physical Activity:   . Days of Exercise per Week: Not on file  . Minutes of Exercise per Session: Not on file  Stress:   . Feeling of Stress : Not on file  Social Connections:   . Frequency of Communication with Friends and Family: Not on file  . Frequency of Social Gatherings with Friends and Family: Not on file  . Attends Religious Services: Not on file  . Active Member of Clubs or Organizations: Not on file  . Attends Archivist Meetings: Not on file  . Marital Status: Not on file  Intimate Partner Violence:   . Fear of Current or Ex-Partner: Not on file  . Emotionally Abused: Not on file  . Physically Abused: Not on file  . Sexually Abused: Not on file     Current Outpatient Medications:  .  glipiZIDE (GLUCOTROL) 5 MG tablet, Take 5 mg by mouth daily before breakfast., Disp: , Rfl:  .  simvastatin (ZOCOR) 10 MG tablet, TAKE 1 TABLET BY MOUTH EVERY DAY, Disp: 30 tablet, Rfl: 6 .  lisinopril (ZESTRIL) 10 MG tablet, Take 1 tablet (10 mg total) by mouth daily., Disp: 90 tablet, Rfl: 3   No Known Allergies  ROS Review of Systems  Constitutional: Negative.   HENT: Negative.   Eyes: Negative.   Respiratory: Negative.  Negative for chest tightness and shortness of breath.   Cardiovascular: Negative.  Negative for chest pain and leg swelling.  Gastrointestinal: Negative.   Endocrine: Negative.   Genitourinary: Negative.   Musculoskeletal: Negative.   Skin: Negative.   Allergic/Immunologic: Negative.   Neurological: Negative.   Hematological: Negative.   Psychiatric/Behavioral: Negative.   All other systems reviewed and are negative.    OBJECTIVE:    Physical Exam Vitals reviewed.  Constitutional:      Appearance: Normal  appearance. She is obese.  HENT:     Mouth/Throat:     Mouth: Mucous membranes are moist.  Eyes:     Pupils: Pupils are equal, round, and reactive to light.  Neck:     Vascular: No carotid bruit.  Cardiovascular:     Rate and Rhythm: Normal rate and regular rhythm.     Pulses: Normal pulses.     Heart sounds: Normal heart sounds.  Pulmonary:     Effort: Pulmonary effort is normal.     Breath sounds: Normal breath sounds.  Abdominal:     General: Bowel sounds are normal.     Palpations: Abdomen is soft. There is no hepatomegaly, splenomegaly or mass.     Tenderness: There is no abdominal tenderness.     Hernia: No hernia is present.  Musculoskeletal:        General: No tenderness.     Cervical back: Neck supple.     Right lower leg: No edema.     Left lower leg: No edema.  Skin:    Findings: No rash.  Neurological:     Mental Status: She is alert and oriented to person, place, and time.     Motor: No weakness.  Psychiatric:        Mood and Affect: Mood and affect normal.        Behavior: Behavior normal.     BP (!) 150/77   Pulse 77   Ht _0  (1.448 m)   Wt 166 lb (75.3 kg)   BMI 35.92 kg/m  Wt Readings from Last 3 Encounters:  05/18/20 166 lb (75.3 kg)  02/06/20 162 lb 8 oz (73.7 kg)  01/27/19 167 lb 12.8 oz (76.1 kg)    Health Maintenance Due  Topic Date Due  . Hepatitis C Screening  Never done  . PNEUMOCOCCAL POLYSACCHARIDE VACCINE AGE 48-64 HIGH RISK  Never done  . FOOT EXAM  Never done  . OPHTHALMOLOGY EXAM  Never done  . COVID-19 Vaccine (1) Never done  . HIV Screening  Never done  . TETANUS/TDAP  Never done  . PAP SMEAR-Modifier  Never done  . COLONOSCOPY  Never done  . HEMOGLOBIN A1C  10/22/2019  . INFLUENZA VACCINE  Never done  . URINE MICROALBUMIN  04/23/2020    There are no preventive care reminders to display for this patient.  No flowsheet data found. No flowsheet data found.  No results found for: TSH No results found for: ALBUMIN,  ANIONGAP, EGFR, GFR No results found for: CHOL, HDL, LDLCALC, CHOLHDL No results found for: TRIG Lab Results  Component Value Date  HGBA1C 5.9 (H) 04/24/2019      ASSESSMENT & PLAN:   Problem List Items Addressed This Visit      Endocrine   Other specified diabetes mellitus with other specified complication ([place]) - Primary    - The patient's blood sugar is under control on glipizide. - The patient will continue the current treatment regimen.  - I encouraged the patient to regularly check blood sugar.  - I encouraged the patient to monitor diet. I encouraged the patient to eat low-carb and low-sugar to help prevent blood sugar spikes.  - I encouraged the patient to continue following their prescribed treatment plan for diabetes - I informed the patient to get help if blood sugar drops below 25m/dL, or if suddenly have trouble thinking clearly or breathing.          Relevant Medications   glipiZIDE (GLUCOTROL) 5 MG tablet   lisinopril (ZESTRIL) 10 MG tablet   Other Relevant Orders   POCT glucose (manual entry) (Completed)     Other   Class 2 obesity due to excess calories with body mass index (BMI) of 35.0 to 35.9 in adult    - I encouraged the patient to lose weight.  - I educated them on making healthy dietary choices including eating more fruits and vegetables and less fried foods. - I encouraged the patient to exercise more, and educated on the benefits of exercise including weight loss, diabetes management, and hypertension management.        Relevant Medications   glipiZIDE (GLUCOTROL) 5 MG tablet   Lipid disorder    - The patient's hyperlipidemia is stable on statin. - The patient will continue the current treatment regimen.  - I encouraged the patient to eat more vegetables and whole wheat, and to avoid fatty foods like whole milk, hard cheese, egg yolks, margarine, baked sweets, and fried foods.  - I encouraged the patient to live an active lifestyle and  complete activities for 40 minutes at least three times per week.  - I instructed the patient to go to the ER if they begin having chest pain.          Meds ordered this encounter  Medications  . lisinopril (ZESTRIL) 10 MG tablet    Sig: Take 1 tablet (10 mg total) by mouth daily.    Dispense:  90 tablet    Refill:  3    Follow-up: Return in about 3 months (around 08/18/2020).    JCletis Athens MD [facility] [place] [id number]  By signing my name below, I, [employer] attest that this documentation has been prepared under the direction and in the presence of Dr. JCletis AthensElectronically Signed: JCletis Athens MD 05/18/20, 10:45 AM  I personally performed the services described in this documentation, which was SCRIBED in my presence. The recorded information has been reviewed and considered accurate. It has been edited as necessary during review. JCletis Athens MD

## 2020-05-18 NOTE — Assessment & Plan Note (Signed)

## 2020-05-18 NOTE — Assessment & Plan Note (Signed)
-   Today, the patient's blood pressure is well managed on lisinopril. - The patient will continue the current treatment regimen.  - I encouraged the patient to eat a low-sodium diet to help control blood pressure. - I encouraged the patient to live an active lifestyle and complete activities that increases heart rate to 85% target heart rate at least 5 times per week for one hour.     

## 2020-05-18 NOTE — Assessment & Plan Note (Signed)
-   I encouraged the patient to lose weight.  - I educated them on making healthy dietary choices including eating more fruits and vegetables and less fried foods. - I encouraged the patient to exercise more, and educated on the benefits of exercise including weight loss, diabetes management, and hypertension management.   

## 2020-08-18 ENCOUNTER — Ambulatory Visit: Payer: BC Managed Care – PPO | Admitting: Internal Medicine

## 2020-09-18 IMAGING — MG DIGITAL SCREENING BILAT W/ TOMO W/ CAD
8 series · 8 of 24 positions shown · non-contrast
Comparison: Previous exam(s).

CLINICAL DATA: Screening.

EXAM:
DIGITAL SCREENING BILATERAL MAMMOGRAM WITH TOMO AND CAD

[L MLO synth-2D]
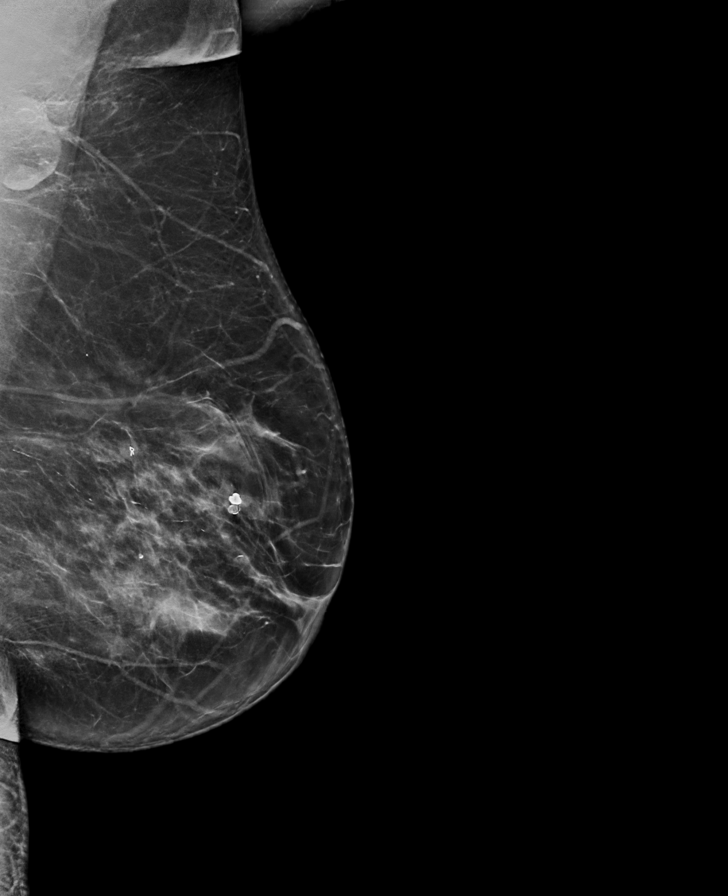

[L CC synth-2D]
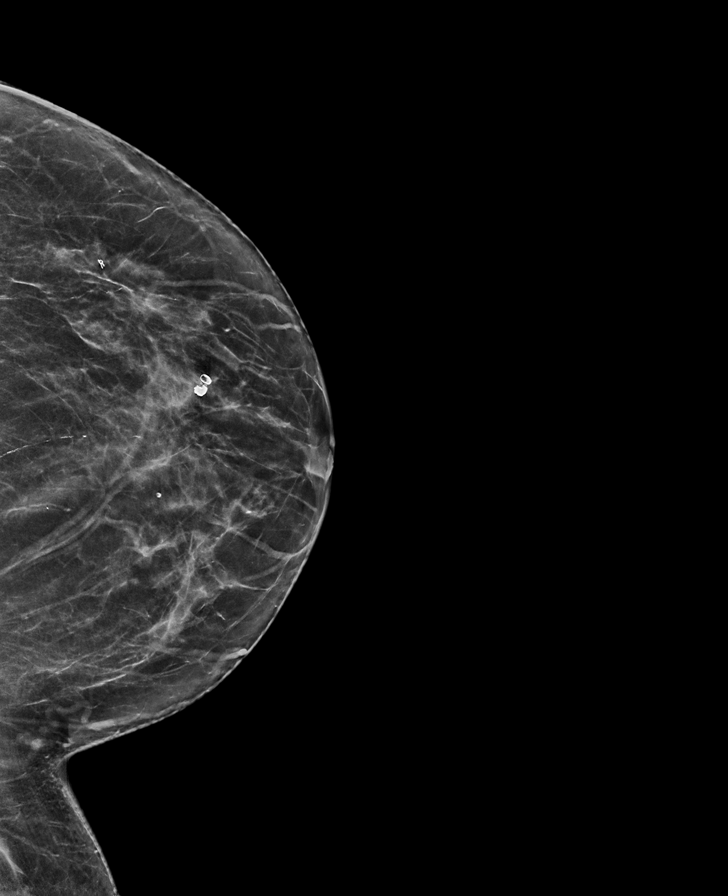

[R MLO synth-2D]
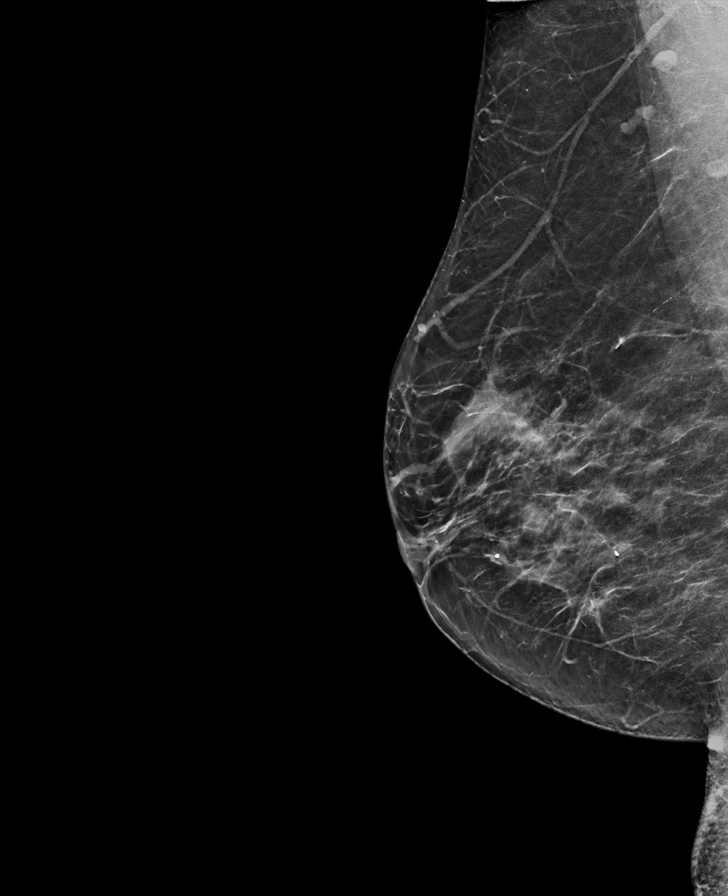

[R CC synth-2D]
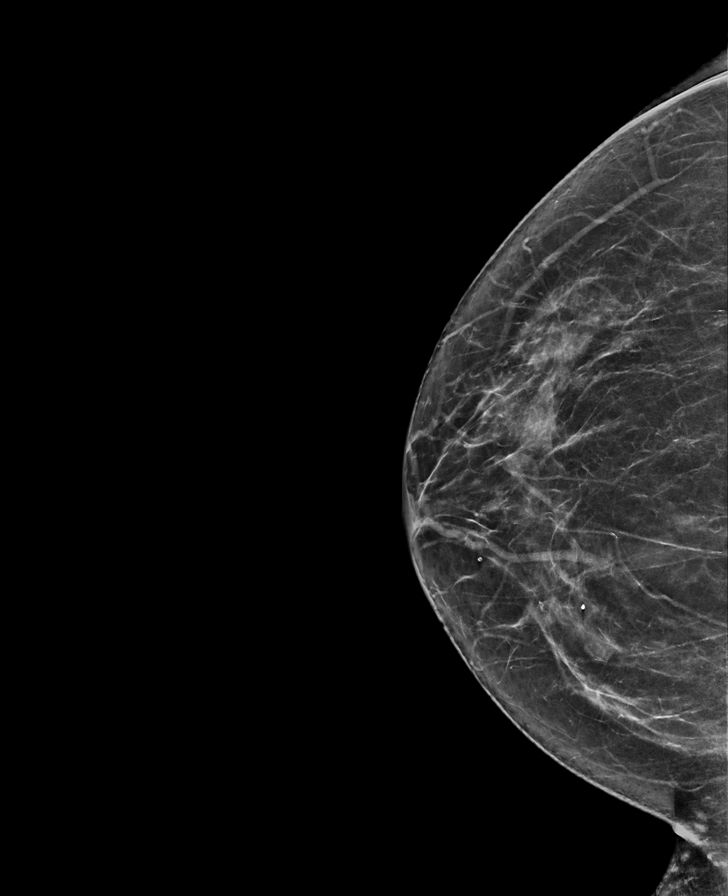

[R MLO tomo · tomo slice 35/70.0]
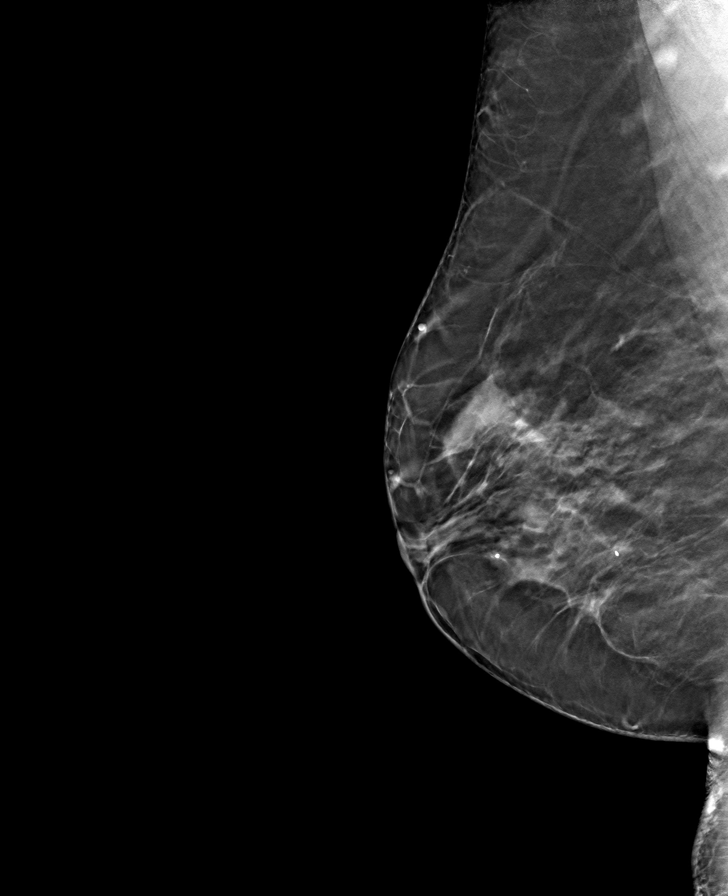

[L MLO tomo · tomo slice 39/77.0]
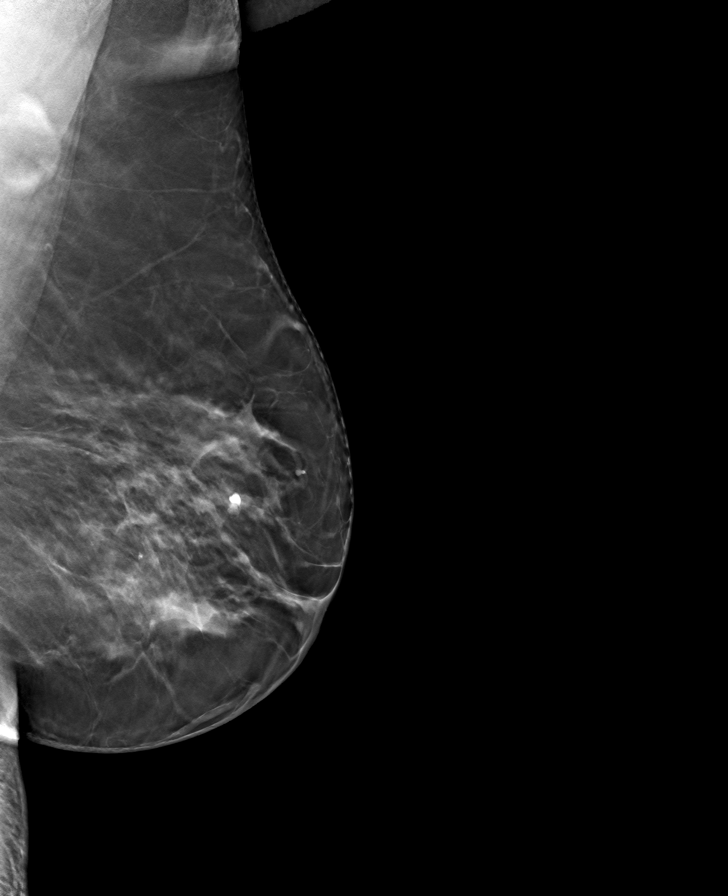

[L CC tomo · tomo slice 34/67.0]
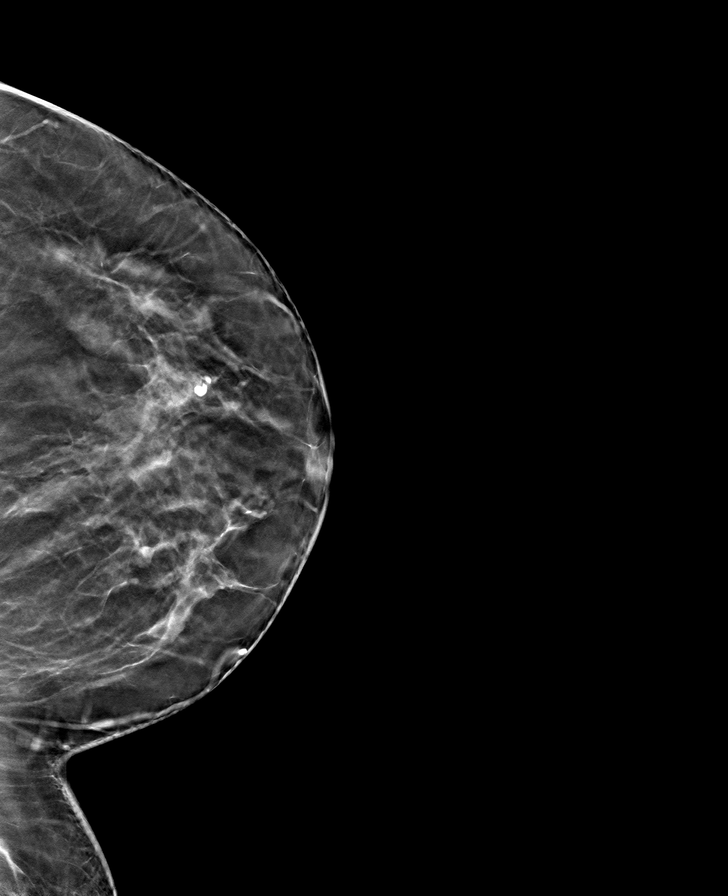

[R CC tomo · tomo slice 35/69.0]
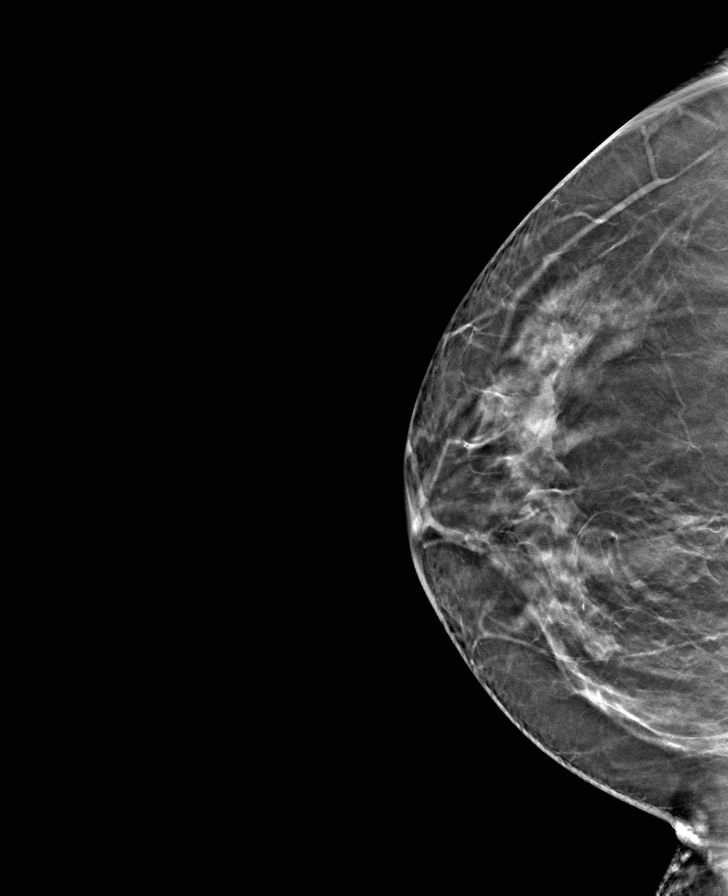

[8 of 24 positions shown; findings below may reference images not displayed]

ACR Breast Density Category b: There are scattered areas of
fibroglandular density.
FINDINGS: There are no findings suspicious for malignancy. Images were
processed with CAD.
IMPRESSION: No mammographic evidence of malignancy. A result letter of this
screening mammogram will be mailed directly to the patient.

RECOMMENDATION:
Screening mammogram in one year. (Code:[id number])

BI-RADS CATEGORY  1: Negative.

## 2021-03-16 ENCOUNTER — Ambulatory Visit (INDEPENDENT_AMBULATORY_CARE_PROVIDER_SITE_OTHER): Payer: BC Managed Care – PPO | Admitting: Internal Medicine

## 2021-03-16 ENCOUNTER — Encounter: Payer: Self-pay | Admitting: Internal Medicine

## 2021-03-16 ENCOUNTER — Other Ambulatory Visit: Payer: Self-pay

## 2021-03-16 VITALS — BP 137/69 | HR 83 | Ht <= 58 in | Wt 168.4 lb

## 2021-03-16 DIAGNOSIS — E119 Type 2 diabetes mellitus without complications: Secondary | ICD-10-CM | POA: Diagnosis not present

## 2021-03-16 DIAGNOSIS — E789 Disorder of lipoprotein metabolism, unspecified: Secondary | ICD-10-CM

## 2021-03-16 DIAGNOSIS — I10 Essential (primary) hypertension: Secondary | ICD-10-CM | POA: Diagnosis not present

## 2021-03-16 DIAGNOSIS — Z6835 Body mass index (BMI) 35.0-35.9, adult: Secondary | ICD-10-CM

## 2021-03-16 DIAGNOSIS — E6609 Other obesity due to excess calories: Secondary | ICD-10-CM

## 2021-03-16 LAB — POCT GLYCOSYLATED HEMOGLOBIN (HGB A1C): Hemoglobin A1C: 8.5 % — AB (ref 4.0–5.6)

## 2021-03-16 LAB — GLUCOSE, POCT (MANUAL RESULT ENTRY): POC Glucose: 187 mg/dl — AB (ref 70–99)

## 2021-03-16 MED ORDER — LISINOPRIL 10 MG PO TABS: 10.0000 mg | ORAL_TABLET | Freq: Every day | ORAL | 3 refills | Status: DC

## 2021-03-16 MED ORDER — SIMVASTATIN 10 MG PO TABS: 10.0000 mg | ORAL_TABLET | Freq: Every day | ORAL | 3 refills | Status: DC

## 2021-03-16 MED ORDER — GLIPIZIDE 5 MG PO TABS: 5.0000 mg | ORAL_TABLET | Freq: Every day | ORAL | 2 refills | Status: DC

## 2021-03-16 NOTE — Assessment & Plan Note (Signed)
Hypercholesterolemia  I advised the patient to follow Mediterranean diet This diet is rich in fruits vegetables and whole grain, and This diet is also rich in fish and lean meat Patient should also eat a handful of almonds or walnuts daily Recent heart study indicated that average follow-up on this kind of diet reduces the cardiovascular mortality by 50 to 70%== 

## 2021-03-16 NOTE — Progress Notes (Signed)
Established Patient Office Visit  SUBJECTIVE:  Subjective  Patient ID: Cindy Shah, female    DOB: [birth year]  Age: 60 y.o. MRN: [id number]  CC:  Chief Complaint  Patient presents with   Follow-up    Patient is here for routine blood pressure and DM check up    HPI Cindy Shah is a 60 y.o. female presenting today for a diabetes follow up.   Her blood sugar today is 120. Her blood pressure is 150/77. She feels good today. She is taking her medication as directed and without any difficulty. She denies any missed doses.   She is vaccinated against COVID19. She is not currently vaccinated against influenza, and she would like one today.    Past Medical History:  Diagnosis Date   Benign neoplasm of breast    Right   Benign neoplasm of left breast 09/04/2014   Diabetes mellitus without complication ([place])    NIDDM   Fibroadenoma of breast 02/03/2013    Past Surgical History:  Procedure Laterality Date   BREAST EXCISIONAL BIOPSY Left 02-11-14   fibroadenoma   BREAST EXCISIONAL BIOPSY Right 2010   benign   BREAST SURGERY Right 2010   Mass excision   CESAREAN SECTION  1999   CHEST SURGERY  2002   CHOLECYSTECTOMY  1995   FINGER SURGERY  1995    Family History  Problem Relation Age of Onset   Breast cancer Neg Hx    Colon cancer Neg Hx     Social History   Socioeconomic History   Marital status: Married    Spouse name: Not on file   Number of children: Not on file   Years of education: Not on file   Highest education level: Not on file  Occupational History   Not on file  Tobacco Use   Smoking status: Never   Smokeless tobacco: Never  Substance and Sexual Activity   Alcohol use: No   Drug use: No   Sexual activity: Not on file  Other Topics Concern   Not on file  Social History Narrative   Not on file   Social Determinants of Health   Financial Resource Strain: Not on file  Food Insecurity: Not on file  Transportation Needs: Not on file   Physical Activity: Not on file  Stress: Not on file  Social Connections: Not on file  Intimate Partner Violence: Not on file     Current Outpatient Medications:    lisinopril (ZESTRIL) 10 MG tablet, Take 1 tablet (10 mg total) by mouth daily., Disp: 90 tablet, Rfl: 3   simvastatin (ZOCOR) 10 MG tablet, Take 1 tablet (10 mg total) by mouth at bedtime., Disp: 90 tablet, Rfl: 3   glipiZIDE (GLUCOTROL) 5 MG tablet, Take 1 tablet (5 mg total) by mouth daily before breakfast., Disp: 90 tablet, Rfl: 2   No Known Allergies  ROS Review of Systems  Constitutional: Negative.   HENT: Negative.    Eyes: Negative.   Respiratory: Negative.  Negative for chest tightness and shortness of breath.   Cardiovascular: Negative.  Negative for chest pain and leg swelling.  Gastrointestinal: Negative.   Endocrine: Negative.   Genitourinary: Negative.   Musculoskeletal: Negative.   Skin: Negative.   Allergic/Immunologic: Negative.  Negative for food allergies.  Neurological: Negative.  Negative for headaches.  Hematological: Negative.  Negative for adenopathy.  Psychiatric/Behavioral: Negative.  Negative for agitation.   All other systems reviewed and are negative.   OBJECTIVE:  Physical Exam Vitals reviewed.  Constitutional:      Appearance: Normal appearance. She is obese.  HENT:     Mouth/Throat:     Mouth: Mucous membranes are moist.  Eyes:     Pupils: Pupils are equal, round, and reactive to light.  Neck:     Vascular: No carotid bruit.  Cardiovascular:     Rate and Rhythm: Normal rate and regular rhythm.     Pulses: Normal pulses.     Heart sounds: Normal heart sounds.  Pulmonary:     Effort: Pulmonary effort is normal.     Breath sounds: Normal breath sounds.  Abdominal:     General: Bowel sounds are normal.     Palpations: Abdomen is soft. There is no hepatomegaly, splenomegaly or mass.     Tenderness: There is no abdominal tenderness.     Hernia: No hernia is present.   Musculoskeletal:        General: No tenderness.     Cervical back: Neck supple.     Right lower leg: No edema.     Left lower leg: No edema.  Skin:    Findings: No rash.  Neurological:     Mental Status: She is alert and oriented to person, place, and time.     Motor: No weakness.  Psychiatric:        Mood and Affect: Mood and affect normal.        Behavior: Behavior normal.    BP 137/69   Pulse 83   Ht 4' 9"  (1.448 m)   Wt 168 lb 6.4 oz (76.4 kg)   BMI 36.44 kg/m  Wt Readings from Last 3 Encounters:  03/16/21 168 lb 6.4 oz (76.4 kg)  05/18/20 166 lb (75.3 kg)  02/06/20 162 lb 8 oz (73.7 kg)    Health Maintenance Due  Topic Date Due   PNEUMOCOCCAL POLYSACCHARIDE VACCINE AGE 105-64 HIGH RISK  Never done   FOOT EXAM  Never done   OPHTHALMOLOGY EXAM  Never done   HIV Screening  Never done   Hepatitis C Screening  Never done   TETANUS/TDAP  Never done   PAP SMEAR-Modifier  Never done   COLONOSCOPY (Pts 45-10yr Insurance coverage will need to be confirmed)  Never done   Zoster Vaccines- Shingrix (1 of 2) Never done   HEMOGLOBIN A1C  10/22/2019   COVID-19 Vaccine (3 - Booster for Moderna series) 04/19/2020   URINE MICROALBUMIN  04/23/2020   INFLUENZA VACCINE  03/14/2021    There are no preventive care reminders to display for this patient.  No flowsheet data found. No flowsheet data found.  No results found for: TSH No results found for: ALBUMIN, ANIONGAP, EGFR, GFR No results found for: CHOL, HDL, LDLCALC, CHOLHDL No results found for: TRIG Lab Results  Component Value Date   HGBA1C 5.9 (H) 04/24/2019      ASSESSMENT & PLAN:   Problem List Items Addressed This Visit       Cardiovascular and Mediastinum   Essential hypertension     Patient denies any chest pain or shortness of breath there is no history of palpitation or paroxysmal nocturnal dyspnea   patient was advised to follow low-salt low-cholesterol diet    ideally I want to keep systolic blood  pressure below 130 mmHg, patient was asked to check blood pressure one times a week and give me a report on that.  Patient will be follow-up in 3 months  or earlier as needed, patient will  call me back for any change in the cardiovascular symptoms Patient was advised to buy a book from local bookstore concerning blood pressure and read several chapters  every day.  This will be supplemented by some of the material we will give him from the office.  Patient should also utilize other resources like YouTube and Internet to learn more about the blood pressure and the diet.       Relevant Medications   lisinopril (ZESTRIL) 10 MG tablet   simvastatin (ZOCOR) 10 MG tablet     Other   Class 2 obesity due to excess calories with body mass index (BMI) of 35.0 to 35.9 in adult    - I encouraged the patient to lose weight.  - I educated them on making healthy dietary choices including eating more fruits and vegetables and less fried foods. - I encouraged the patient to exercise more, and educated on the benefits of exercise including weight loss, diabetes prevention, and hypertension prevention.   Dietary counseling with a registered dietician  Referral to a weight management support group (e.g. Weight Watchers, Overeaters Anonymous)  If your BMI is greater than 29 or you have gained more than 15 pounds you should work on weight loss.  Attend a healthy cooking class        Relevant Medications   glipiZIDE (GLUCOTROL) 5 MG tablet   Lipid disorder    Hypercholesterolemia  I advised the patient to follow Mediterranean diet This diet is rich in fruits vegetables and whole grain, and This diet is also rich in fish and lean meat Patient should also eat a handful of almonds or walnuts daily Recent heart study indicated that average follow-up on this kind of diet reduces the cardiovascular mortality by 50 to 70%==       Other Visit Diagnoses     Type 2 diabetes mellitus without complication,  without long-term current use of insulin (HCC)    -  Primary   Relevant Medications   glipiZIDE (GLUCOTROL) 5 MG tablet   lisinopril (ZESTRIL) 10 MG tablet   simvastatin (ZOCOR) 10 MG tablet   Other Relevant Orders   POCT glucose (manual entry) (Completed)   POCT glycosylated hemoglobin (Hb A1C)       Meds ordered this encounter  Medications   glipiZIDE (GLUCOTROL) 5 MG tablet    Sig: Take 1 tablet (5 mg total) by mouth daily before breakfast.    Dispense:  90 tablet    Refill:  2   lisinopril (ZESTRIL) 10 MG tablet    Sig: Take 1 tablet (10 mg total) by mouth daily.    Dispense:  90 tablet    Refill:  3   simvastatin (ZOCOR) 10 MG tablet    Sig: Take 1 tablet (10 mg total) by mouth at bedtime.    Dispense:  90 tablet    Refill:  3    Follow-up: No follow-ups on file.    Cletis Athens, MD [facility] [address] [place], [place] [address]   By signing my name below, I, [employer], attest that this documentation has been prepared under the direction and in the presence of Dr. Cletis Athens Electronically Signed: Cletis Athens, MD 03/16/21, 2:30 PM  I personally performed the services described in this documentation, which was SCRIBED in my presence. The recorded information has been reviewed and considered accurate. It has been edited as necessary during review. Cletis Athens, MD

## 2021-03-16 NOTE — Assessment & Plan Note (Signed)

## 2021-03-16 NOTE — Assessment & Plan Note (Signed)

## 2021-06-02 DIAGNOSIS — R21 Rash and other nonspecific skin eruption: Secondary | ICD-10-CM | POA: Diagnosis not present

## 2021-06-14 ENCOUNTER — Other Ambulatory Visit: Payer: Self-pay

## 2021-06-14 ENCOUNTER — Encounter: Payer: Self-pay | Admitting: Internal Medicine

## 2021-06-14 ENCOUNTER — Ambulatory Visit (INDEPENDENT_AMBULATORY_CARE_PROVIDER_SITE_OTHER): Payer: BC Managed Care – PPO | Admitting: Internal Medicine

## 2021-06-14 VITALS — BP 139/68 | HR 82 | Ht <= 58 in | Wt 171.2 lb

## 2021-06-14 DIAGNOSIS — Z23 Encounter for immunization: Secondary | ICD-10-CM

## 2021-06-14 DIAGNOSIS — Z0000 Encounter for general adult medical examination without abnormal findings: Secondary | ICD-10-CM | POA: Diagnosis not present

## 2021-06-14 DIAGNOSIS — Z6835 Body mass index (BMI) 35.0-35.9, adult: Secondary | ICD-10-CM

## 2021-06-14 DIAGNOSIS — Z1211 Encounter for screening for malignant neoplasm of colon: Secondary | ICD-10-CM

## 2021-06-14 DIAGNOSIS — E1369 Other specified diabetes mellitus with other specified complication: Secondary | ICD-10-CM | POA: Diagnosis not present

## 2021-06-14 DIAGNOSIS — Z124 Encounter for screening for malignant neoplasm of cervix: Secondary | ICD-10-CM

## 2021-06-14 DIAGNOSIS — E119 Type 2 diabetes mellitus without complications: Secondary | ICD-10-CM | POA: Diagnosis not present

## 2021-06-14 DIAGNOSIS — I10 Essential (primary) hypertension: Secondary | ICD-10-CM | POA: Diagnosis not present

## 2021-06-14 DIAGNOSIS — E6609 Other obesity due to excess calories: Secondary | ICD-10-CM

## 2021-06-14 DIAGNOSIS — E789 Disorder of lipoprotein metabolism, unspecified: Secondary | ICD-10-CM

## 2021-06-14 LAB — GLUCOSE, POCT (MANUAL RESULT ENTRY): POC Glucose: 167 mg/dl — AB (ref 70–99)

## 2021-06-14 NOTE — Assessment & Plan Note (Signed)

## 2021-06-14 NOTE — Assessment & Plan Note (Signed)

## 2021-06-14 NOTE — Assessment & Plan Note (Signed)

## 2021-06-14 NOTE — Progress Notes (Signed)
Established Patient Office Visit  Subjective:  Patient ID: Cindy Shah, female    DOB: 14-Apr-1961  Age: 60 y.o. MRN: [id number]  CC:  Chief Complaint  Patient presents with   Annual Exam    HPI  Cindy Shah presents for physical  Past Medical History:  Diagnosis Date   Benign neoplasm of breast    Right   Benign neoplasm of left breast 09/04/2014   Diabetes mellitus without complication ([place])    NIDDM   Fibroadenoma of breast 02/03/2013    Past Surgical History:  Procedure Laterality Date   BREAST EXCISIONAL BIOPSY Left 02-11-14   fibroadenoma   BREAST EXCISIONAL BIOPSY Right 2010   benign   BREAST SURGERY Right 2010   Mass excision   CESAREAN SECTION  1999   CHEST SURGERY  2002   CHOLECYSTECTOMY  1995   FINGER SURGERY  1995    Family History  Problem Relation Age of Onset   Breast cancer Neg Hx    Colon cancer Neg Hx     Social History   Socioeconomic History   Marital status: Married    Spouse name: Not on file   Number of children: Not on file   Years of education: Not on file   Highest education level: Not on file  Occupational History   Not on file  Tobacco Use   Smoking status: Never   Smokeless tobacco: Never  Substance and Sexual Activity   Alcohol use: No   Drug use: No   Sexual activity: Not on file  Other Topics Concern   Not on file  Social History Narrative   Not on file   Social Determinants of Health   Financial Resource Strain: Not on file  Food Insecurity: Not on file  Transportation Needs: Not on file  Physical Activity: Not on file  Stress: Not on file  Social Connections: Not on file  Intimate Partner Violence: Not on file     Current Outpatient Medications:    glipiZIDE (GLUCOTROL) 5 MG tablet, Take 1 tablet (5 mg total) by mouth daily before breakfast., Disp: 90 tablet, Rfl: 2   lisinopril (ZESTRIL) 10 MG tablet, Take 1 tablet (10 mg total) by mouth daily., Disp: 90 tablet, Rfl: 3   simvastatin (ZOCOR)  10 MG tablet, Take 1 tablet (10 mg total) by mouth at bedtime., Disp: 90 tablet, Rfl: 3   No Known Allergies  ROS Review of Systems  Constitutional: Negative.   HENT: Negative.    Eyes: Negative.   Respiratory: Negative.    Cardiovascular: Negative.   Gastrointestinal: Negative.   Endocrine: Negative.   Genitourinary: Negative.   Musculoskeletal: Negative.   Skin: Negative.   Allergic/Immunologic: Negative.   Neurological: Negative.   Hematological: Negative.   Psychiatric/Behavioral: Negative.    All other systems reviewed and are negative.    Objective:    Physical Exam Vitals reviewed.  Constitutional:      Appearance: Normal appearance.  HENT:     Mouth/Throat:     Mouth: Mucous membranes are moist.  Eyes:     Pupils: Pupils are equal, round, and reactive to light.  Neck:     Vascular: No carotid bruit.  Cardiovascular:     Rate and Rhythm: Normal rate and regular rhythm.     Pulses: Normal pulses.     Heart sounds: Normal heart sounds.  Pulmonary:     Effort: Pulmonary effort is normal.     Breath sounds: Normal breath sounds.  Abdominal:     General: Bowel sounds are normal.     Palpations: Abdomen is soft. There is no hepatomegaly, splenomegaly or mass.     Tenderness: There is no abdominal tenderness.     Hernia: No hernia is present.  Musculoskeletal:        General: No tenderness.     Cervical back: Neck supple.     Right lower leg: No edema.     Left lower leg: No edema.  Skin:    Findings: No rash.  Neurological:     Mental Status: She is alert and oriented to person, place, and time.     Motor: No weakness.  Psychiatric:        Mood and Affect: Mood and affect normal.        Behavior: Behavior normal.    BP 139/68   Pulse 82   Ht 4' 9" (1.448 m)   Wt 171 lb 3.2 oz (77.7 kg)   BMI 37.05 kg/m  Wt Readings from Last 3 Encounters:  06/14/21 171 lb 3.2 oz (77.7 kg)  03/16/21 168 lb 6.4 oz (76.4 kg)  05/18/20 166 lb (75.3 kg)      Health Maintenance Due  Topic Date Due   Pneumococcal Vaccine 33-58 Years old (1 - PCV) Never done   FOOT EXAM  Never done   OPHTHALMOLOGY EXAM  Never done   HIV Screening  Never done   Hepatitis C Screening  Never done   PAP SMEAR-Modifier  Never done   COLONOSCOPY (Pts 45-71yr Insurance coverage will need to be confirmed)  Never done   Zoster Vaccines- Shingrix (1 of 2) Never done   COVID-19 Vaccine (3 - Booster for Moderna series) 01/13/2020   INFLUENZA VACCINE  03/14/2021    There are no preventive care reminders to display for this patient.  No results found for: TSH No results found for: WBC, HGB, HCT, MCV, PLT No results found for: NA, K, CHLORIDE, CO2, GLUCOSE, BUN, CREATININE, BILITOT, ALKPHOS, AST, ALT, PROT, ALBUMIN, CALCIUM, ANIONGAP, EGFR, GFR No results found for: CHOL No results found for: HDL No results found for: LDLCALC No results found for: TRIG No results found for: CHOLHDL Lab Results  Component Value Date   HGBA1C 8.5 (A) 03/16/2021      Assessment & Plan:   Problem List Items Addressed This Visit       Cardiovascular and Mediastinum   Essential hypertension    The following hypertensive lifestyle modification were recommended and discussed:  1. Limiting alcohol intake to less than 1 oz/day of ethanol:(24 oz of beer or 8 oz of wine or 2 oz of 100-proof whiskey). 2. Take baby ASA 81 mg daily. 3. Importance of regular aerobic exercise and losing weight. 4. Reduce dietary saturated fat and cholesterol intake for overall cardiovascular health. 5. Maintaining adequate dietary potassium, calcium, and magnesium intake. 6. Regular monitoring of the blood pressure. 7. Reduce sodium intake to less than 100 mmol/day (less than 2.3 gm of sodium or less than 6 gm of sodium choride)         Endocrine   Other specified diabetes mellitus with other specified complication ([place]    - The patient's blood sugar is labile on med. - The patient will continue  the current treatment regimen.  - I encouraged the patient to regularly check blood sugar.  - I encouraged the patient to monitor diet. I encouraged the patient to eat low-carb and low-sugar to help prevent blood sugar spikes.  -  I encouraged the patient to continue following their prescribed treatment plan for diabetes - I informed the patient to get help if blood sugar drops below 11m/dL, or if suddenly have trouble thinking clearly or breathing.  Patient was advised to buy a book on diabetes from a local bookstore or from [place]  Patient should read 2 chapters every day to keep the motivation going, this is in addition to some of the materials we provided them from the office.  There are other resources on the Internet like YouTube and wilkipedia to get an education on the diabetes        Other   Class 2 obesity due to excess calories with body mass index (BMI) of 35.0 to 35.9 in adult    - I encouraged the patient to lose weight.  - I educated them on making healthy dietary choices including eating more fruits and vegetables and less fried foods. - I encouraged the patient to exercise more, and educated on the benefits of exercise including weight loss, diabetes prevention, and hypertension prevention.   Dietary counseling with a registered dietician  Referral to a weight management support group (e.g. Weight Watchers, Overeaters Anonymous)  If your BMI is greater than 29 or you have gained more than 15 pounds you should work on weight loss.  Attend a healthy cooking class       Lipid disorder   Other Visit Diagnoses     Cervical cancer screening    -  Primary   Relevant Orders   Ambulatory referral to Obstetrics / Gynecology   Colon cancer screening       Relevant Orders   Ambulatory referral to Gastroenterology   Need for Tdap vaccination       Relevant Orders   Tdap vaccine greater than or equal to 7yo IM (Completed)   Type 2 diabetes mellitus without complication, without  long-term current use of insulin (HCC)       Relevant Orders   POCT glucose (manual entry) (Completed)     Hypercholesterolemia  I advised the patient to follow Mediterranean diet This diet is rich in fruits vegetables and whole grain, and This diet is also rich in fish and lean meat Patient should also eat a handful of almonds or walnuts daily Recent heart study indicated that average follow-up on this kind of diet reduces the cardiovascular mortality by 50 to 70%==  No orders of the defined types were placed in this encounter.   Follow-up: No follow-ups on file.    JCletis Athens MD

## 2021-06-27 ENCOUNTER — Encounter: Payer: Self-pay | Admitting: Obstetrics and Gynecology

## 2021-08-02 ENCOUNTER — Ambulatory Visit (INDEPENDENT_AMBULATORY_CARE_PROVIDER_SITE_OTHER): Payer: BC Managed Care – PPO

## 2021-08-02 DIAGNOSIS — Z1159 Encounter for screening for other viral diseases: Secondary | ICD-10-CM | POA: Diagnosis not present

## 2021-08-02 DIAGNOSIS — I10 Essential (primary) hypertension: Secondary | ICD-10-CM | POA: Diagnosis not present

## 2021-08-02 DIAGNOSIS — Z114 Encounter for screening for human immunodeficiency virus [HIV]: Secondary | ICD-10-CM | POA: Diagnosis not present

## 2021-08-02 DIAGNOSIS — E119 Type 2 diabetes mellitus without complications: Secondary | ICD-10-CM | POA: Diagnosis not present

## 2021-08-05 LAB — CBC WITH DIFFERENTIAL/PLATELET
Absolute Monocytes: 743 cells/uL (ref 200–950)
Basophils Absolute: 68 cells/uL (ref 0–200)
Basophils Relative: 0.9 %
Eosinophils Absolute: 98 cells/uL (ref 15–500)
Eosinophils Relative: 1.3 %
HCT: 41.2 % (ref 35.0–45.0)
Hemoglobin: 13.6 g/dL (ref 11.7–15.5)
Lymphs Abs: 3083 cells/uL (ref 850–3900)
MCH: 29.3 pg (ref 27.0–33.0)
MCHC: 33 g/dL (ref 32.0–36.0)
MCV: 88.8 fL (ref 80.0–100.0)
MPV: 11.1 fL (ref 7.5–12.5)
Monocytes Relative: 9.9 %
Neutro Abs: 3510 cells/uL (ref 1500–7800)
Neutrophils Relative %: 46.8 %
Platelets: 277 10*3/uL (ref 140–400)
RBC: 4.64 10*6/uL (ref 3.80–5.10)
RDW: 12.2 % (ref 11.0–15.0)
Total Lymphocyte: 41.1 %
WBC: 7.5 10*3/uL (ref 3.8–10.8)

## 2021-08-05 LAB — COMPLETE METABOLIC PANEL WITH GFR
AG Ratio: 1.6 (calc) (ref 1.0–2.5)
ALT: 29 U/L (ref 6–29)
AST: 23 U/L (ref 10–35)
Albumin: 4.6 g/dL (ref 3.6–5.1)
Alkaline phosphatase (APISO): 82 U/L (ref 37–153)
BUN: 15 mg/dL (ref 7–25)
CO2: 25 mmol/L (ref 20–32)
Calcium: 9.5 mg/dL (ref 8.6–10.4)
Chloride: 104 mmol/L (ref 98–110)
Creat: 0.54 mg/dL (ref 0.50–1.03)
Globulin: 2.8 g/dL (calc) (ref 1.9–3.7)
Glucose, Bld: 160 mg/dL — ABNORMAL HIGH (ref 65–99)
Potassium: 4.1 mmol/L (ref 3.5–5.3)
Sodium: 138 mmol/L (ref 135–146)
Total Bilirubin: 0.7 mg/dL (ref 0.2–1.2)
Total Protein: 7.4 g/dL (ref 6.1–8.1)
eGFR: 107 mL/min/{1.73_m2} (ref >=60)

## 2021-08-05 LAB — LIPID PANEL
Cholesterol: 171 mg/dL (ref <200)
HDL: 47 mg/dL — ABNORMAL LOW (ref >=50)
LDL Cholesterol (Calc): 99 mg/dL (calc)
Non-HDL Cholesterol (Calc): 124 mg/dL (calc) (ref <130)
Total CHOL/HDL Ratio: 3.6 (calc) (ref <5.0)
Triglycerides: 155 mg/dL — ABNORMAL HIGH (ref <150)

## 2021-08-05 LAB — HEPATITIS C ANTIBODY
Hepatitis C Ab: NONREACTIVE
SIGNAL TO CUT-OFF: 0.06 (ref <1.00)

## 2021-08-05 LAB — HEMOGLOBIN A1C
Hgb A1c MFr Bld: 9 % of total Hgb — ABNORMAL HIGH (ref <5.7)
Mean Plasma Glucose: 212 mg/dL
eAG (mmol/L): 11.7 mmol/L

## 2021-08-05 LAB — TSH: TSH: 4.83 mIU/L — ABNORMAL HIGH (ref 0.40–4.50)

## 2021-08-05 LAB — HIV ANTIBODY (ROUTINE TESTING W REFLEX): HIV 1&2 Ab, 4th Generation: NONREACTIVE

## 2022-03-23 ENCOUNTER — Encounter: Payer: Self-pay | Admitting: Nurse Practitioner

## 2022-03-23 ENCOUNTER — Ambulatory Visit (INDEPENDENT_AMBULATORY_CARE_PROVIDER_SITE_OTHER): Payer: BC Managed Care – PPO | Admitting: Nurse Practitioner

## 2022-03-23 VITALS — BP 123/72 | HR 78 | Ht <= 58 in | Wt 166.7 lb

## 2022-03-23 DIAGNOSIS — E669 Obesity, unspecified: Secondary | ICD-10-CM | POA: Diagnosis not present

## 2022-03-23 DIAGNOSIS — E789 Disorder of lipoprotein metabolism, unspecified: Secondary | ICD-10-CM

## 2022-03-23 DIAGNOSIS — E1369 Other specified diabetes mellitus with other specified complication: Secondary | ICD-10-CM

## 2022-03-23 DIAGNOSIS — I10 Essential (primary) hypertension: Secondary | ICD-10-CM

## 2022-03-23 LAB — GLUCOSE, POCT (MANUAL RESULT ENTRY): POC Glucose: 212 mg/dl — AB (ref 70–99)

## 2022-03-23 LAB — POCT GLYCOSYLATED HEMOGLOBIN (HGB A1C): HbA1c POC (<> result, manual entry): 8.1 % (ref 4.0–5.6)

## 2022-03-23 MED ORDER — GLIPIZIDE 5 MG PO TABS: 5.0000 mg | ORAL_TABLET | Freq: Every day | ORAL | 3 refills | Status: DC

## 2022-03-23 MED ORDER — METFORMIN HCL 500 MG PO TABS: 500.0000 mg | ORAL_TABLET | Freq: Every day | ORAL | 2 refills | Status: DC

## 2022-03-23 MED ORDER — SIMVASTATIN 10 MG PO TABS: 10.0000 mg | ORAL_TABLET | Freq: Every day | ORAL | 3 refills | Status: AC

## 2022-03-23 MED ORDER — LISINOPRIL 10 MG PO TABS: 10.0000 mg | ORAL_TABLET | Freq: Every day | ORAL | 3 refills | Status: AC

## 2022-03-23 MED ORDER — METFORMIN HCL 500 MG PO TABS: 500.0000 mg | ORAL_TABLET | Freq: Every day | ORAL | 3 refills | Status: DC

## 2022-03-23 NOTE — Assessment & Plan Note (Signed)
Patient blood pressure 123/72 in the office today. Advised patient to continue low-salt and heart healthy diet. Continue lisinopril 10 mg daily.

## 2022-03-23 NOTE — Progress Notes (Signed)
Established Patient Office Visit  Subjective:  Patient ID: Cindy Shah, female    DOB: [birth year]  Age: 61 y.o. MRN: [id number]  CC:  Chief Complaint  Patient presents with   Diabetes     HPI  Cindy Shah presents for routine follow up. She has h/o hypertension, DM and hyperlipidemia. She is taking glipizide 5 mg, lisinopril 10 mg and simvastatin 10 mg.  Her last hemoglobin A1c was 9.0 on August 02, 2021.  HPI   Past Medical History:  Diagnosis Date   Benign neoplasm of breast    Right   Benign neoplasm of left breast 09/04/2014   Diabetes mellitus without complication ([place])    NIDDM   Fibroadenoma of breast 02/03/2013    Past Surgical History:  Procedure Laterality Date   BREAST EXCISIONAL BIOPSY Left 02-11-14   fibroadenoma   BREAST EXCISIONAL BIOPSY Right 2010   benign   BREAST SURGERY Right 2010   Mass excision   CESAREAN SECTION  1999   CHEST SURGERY  2002   CHOLECYSTECTOMY  1995   FINGER SURGERY  1995    Family History  Problem Relation Age of Onset   Breast cancer Neg Hx    Colon cancer Neg Hx     Social History   Socioeconomic History   Marital status: Married    Spouse name: Not on file   Number of children: Not on file   Years of education: Not on file   Highest education level: Not on file  Occupational History   Not on file  Tobacco Use   Smoking status: Never   Smokeless tobacco: Never  Substance and Sexual Activity   Alcohol use: No   Drug use: No   Sexual activity: Not on file  Other Topics Concern   Not on file  Social History Narrative   Not on file   Social Determinants of Health   Financial Resource Strain: Not on file  Food Insecurity: Not on file  Transportation Needs: Not on file  Physical Activity: Not on file  Stress: Not on file  Social Connections: Not on file  Intimate Partner Violence: Not on file     Outpatient Medications Prior to Visit  Medication Sig Dispense Refill   glipiZIDE  (GLUCOTROL) 5 MG tablet Take 1 tablet (5 mg total) by mouth daily before breakfast. 90 tablet 2   lisinopril (ZESTRIL) 10 MG tablet Take 1 tablet (10 mg total) by mouth daily. 90 tablet 3   simvastatin (ZOCOR) 10 MG tablet Take 1 tablet (10 mg total) by mouth at bedtime. 90 tablet 3   No facility-administered medications prior to visit.    No Known Allergies  ROS Review of Systems  Constitutional:  Negative for activity change and fatigue.  HENT: Negative.    Eyes: Negative.   Respiratory:  Negative for apnea and shortness of breath.   Cardiovascular:  Negative for chest pain and palpitations.  Gastrointestinal: Negative.   Endocrine: Negative.   Genitourinary: Negative.   Musculoskeletal: Negative.   Skin: Negative.   Neurological:  Negative for dizziness, facial asymmetry, light-headedness and headaches.  Psychiatric/Behavioral:  Negative for agitation, behavioral problems and confusion. The patient is not nervous/anxious.       Objective:    Physical Exam Constitutional:      Appearance: Normal appearance. She is obese.  HENT:     Head: Normocephalic.     Right Ear: Tympanic membrane normal.     Left Ear: Tympanic membrane normal.  Nose: Nose normal.     Mouth/Throat:     Mouth: Mucous membranes are moist.     Pharynx: Oropharynx is clear.  Eyes:     Extraocular Movements: Extraocular movements intact.     Conjunctiva/sclera: Conjunctivae normal.     Pupils: Pupils are equal, round, and reactive to light.  Cardiovascular:     Rate and Rhythm: Normal rate and regular rhythm.     Pulses: Normal pulses.     Heart sounds: Normal heart sounds.  Pulmonary:     Effort: Pulmonary effort is normal. No respiratory distress.     Breath sounds: Normal breath sounds. No rhonchi.  Abdominal:     General: Bowel sounds are normal.     Palpations: Abdomen is soft. There is no mass.     Tenderness: There is no abdominal tenderness.     Hernia: No hernia is present.   Musculoskeletal:        General: Normal range of motion.     Cervical back: Neck supple. No tenderness.  Skin:    General: Skin is warm.     Capillary Refill: Capillary refill takes less than 2 seconds.  Neurological:     General: No focal deficit present.     Mental Status: She is alert and oriented to person, place, and time. Mental status is at baseline.  Psychiatric:        Mood and Affect: Mood normal.        Behavior: Behavior normal.        Thought Content: Thought content normal.        Judgment: Judgment normal.     BP 123/72   Pulse 78   Ht 4' 9"  (1.448 m)   Wt 166 lb 11.2 oz (75.6 kg)   BMI 36.07 kg/m  Wt Readings from Last 3 Encounters:  03/23/22 166 lb 11.2 oz (75.6 kg)  06/14/21 171 lb 3.2 oz (77.7 kg)  03/16/21 168 lb 6.4 oz (76.4 kg)     Health Maintenance  Topic Date Due   FOOT EXAM  Never done   OPHTHALMOLOGY EXAM  Never done   PAP SMEAR-Modifier  Never done   COLONOSCOPY (Pts 45-39yr Insurance coverage will need to be confirmed)  Never done   Zoster Vaccines- Shingrix (1 of 2) Never done   COVID-19 Vaccine (3 - Moderna series) 01/13/2020   MAMMOGRAM  11/09/2021   INFLUENZA VACCINE  03/14/2022   HEMOGLOBIN A1C  09/23/2022   TETANUS/TDAP  06/15/2031   Hepatitis C Screening  Completed   HIV Screening  Completed   HPV VACCINES  Aged Out    There are no preventive care reminders to display for this patient.  Lab Results  Component Value Date   TSH 4.83 (H) 08/02/2021   Lab Results  Component Value Date   WBC 7.5 08/02/2021   HGB 13.6 08/02/2021   HCT 41.2 08/02/2021   MCV 88.8 08/02/2021   PLT 277 08/02/2021   Lab Results  Component Value Date   NA 138 08/02/2021   K 4.1 08/02/2021   CO2 25 08/02/2021   GLUCOSE 160 (H) 08/02/2021   BUN 15 08/02/2021   CREATININE 0.54 08/02/2021   BILITOT 0.7 08/02/2021   AST 23 08/02/2021   ALT 29 08/02/2021   PROT 7.4 08/02/2021   CALCIUM 9.5 08/02/2021   EGFR 107 08/02/2021   Lab Results   Component Value Date   CHOL 171 08/02/2021   Lab Results  Component Value Date  HDL 47 (L) 08/02/2021   Lab Results  Component Value Date   LDLCALC 99 08/02/2021   Lab Results  Component Value Date   TRIG 155 (H) 08/02/2021   Lab Results  Component Value Date   CHOLHDL 3.6 08/02/2021   Lab Results  Component Value Date   HGBA1C 8.1 03/23/2022      Assessment & Plan:   Problem List Items Addressed This Visit       Cardiovascular and Mediastinum   Essential hypertension - Primary    Patient blood pressure 123/72 in the office today. Advised patient to continue low-salt and heart healthy diet. Continue lisinopril 10 mg daily.      Relevant Medications   lisinopril (ZESTRIL) 10 MG tablet   simvastatin (ZOCOR) 10 MG tablet     Endocrine   Other specified diabetes mellitus with other specified complication ([place])    Patient hemoglobin A1c 8.1 in the office today. Advised pt to eat variety of food including fruits, vegetables, whole grains, complex carbohydrates and proteins. Started her on metformin 500 mg once a day. Continue glipizide 5 mg.        Relevant Medications   glipiZIDE (GLUCOTROL) 5 MG tablet   lisinopril (ZESTRIL) 10 MG tablet   simvastatin (ZOCOR) 10 MG tablet   metFORMIN (GLUCOPHAGE) 500 MG tablet   Other Relevant Orders   POCT glycosylated hemoglobin (Hb A1C) (Completed)   Microalbumin, urine   POCT glucose (manual entry) (Completed)     Other   Lipid disorder    Continue simvastatin 10 mg once a day.      Obesity (BMI 35.0-39.9 without comorbidity)    Body mass index is 36.07 kg/m. Advised pt to lose weight. Advised patient to avoid trans fat, fatty and fried food. Follow a regular physical activity schedule. Went over the risk of chronic diseases with increased weight.           Relevant Medications   glipiZIDE (GLUCOTROL) 5 MG tablet   metFORMIN (GLUCOPHAGE) 500 MG tablet     Meds ordered this encounter  Medications    glipiZIDE (GLUCOTROL) 5 MG tablet    Sig: Take 1 tablet (5 mg total) by mouth daily before breakfast.    Dispense:  90 tablet    Refill:  3   lisinopril (ZESTRIL) 10 MG tablet    Sig: Take 1 tablet (10 mg total) by mouth daily.    Dispense:  90 tablet    Refill:  3   simvastatin (ZOCOR) 10 MG tablet    Sig: Take 1 tablet (10 mg total) by mouth at bedtime.    Dispense:  90 tablet    Refill:  3   DISCONTD: metFORMIN (GLUCOPHAGE) 500 MG tablet    Sig: Take 1 tablet (500 mg total) by mouth daily with breakfast.    Dispense:  180 tablet    Refill:  3   metFORMIN (GLUCOPHAGE) 500 MG tablet    Sig: Take 1 tablet (500 mg total) by mouth daily.    Dispense:  90 tablet    Refill:  2     Follow-up: Return in about 3 months (around 06/23/2022).    Theresia Lo, NP

## 2022-03-23 NOTE — Assessment & Plan Note (Signed)
Patient hemoglobin A1c 8.1 in the office today. Advised pt to eat variety of food including fruits, vegetables, whole grains, complex carbohydrates and proteins. Started her on metformin 500 mg once a day. Continue glipizide 5 mg.

## 2022-03-23 NOTE — Assessment & Plan Note (Signed)
Continue simvastatin 10 mg once a day.

## 2022-03-23 NOTE — Assessment & Plan Note (Signed)
Body mass index is 36.07 kg/m. Advised pt to lose weight. Advised patient to avoid trans fat, fatty and fried food. Follow a regular physical activity schedule. Went over the risk of chronic diseases with increased weight.

## 2022-04-22 ENCOUNTER — Other Ambulatory Visit: Payer: Self-pay | Admitting: *Deleted

## 2022-04-22 DIAGNOSIS — Z1231 Encounter for screening mammogram for malignant neoplasm of breast: Secondary | ICD-10-CM

## 2022-06-23 ENCOUNTER — Ambulatory Visit (INDEPENDENT_AMBULATORY_CARE_PROVIDER_SITE_OTHER): Payer: BC Managed Care – PPO | Admitting: Nurse Practitioner

## 2022-06-23 ENCOUNTER — Encounter: Payer: Self-pay | Admitting: Nurse Practitioner

## 2022-06-23 VITALS — BP 139/71 | HR 88 | Temp 98.2°F | Ht <= 58 in | Wt 168.8 lb

## 2022-06-23 DIAGNOSIS — I10 Essential (primary) hypertension: Secondary | ICD-10-CM

## 2022-06-23 DIAGNOSIS — Z1211 Encounter for screening for malignant neoplasm of colon: Secondary | ICD-10-CM | POA: Diagnosis not present

## 2022-06-23 DIAGNOSIS — E1369 Other specified diabetes mellitus with other specified complication: Secondary | ICD-10-CM | POA: Diagnosis not present

## 2022-06-23 DIAGNOSIS — Z23 Encounter for immunization: Secondary | ICD-10-CM | POA: Diagnosis not present

## 2022-06-23 DIAGNOSIS — Z6836 Body mass index (BMI) 36.0-36.9, adult: Secondary | ICD-10-CM

## 2022-06-23 LAB — GLUCOSE, POCT (MANUAL RESULT ENTRY): POC Glucose: 260 mg/dl — AB (ref 70–99)

## 2022-06-23 MED ORDER — SITAGLIPTIN PHOSPHATE 25 MG PO TABS: 25.0000 mg | ORAL_TABLET | Freq: Every day | ORAL | 1 refills | Status: AC

## 2022-06-23 NOTE — Assessment & Plan Note (Signed)
Her blood sugar 216 in the office today. Started patient on Januvia and continue glipizide Stop taking metformin due to GI side effects. Advised patient to check blood sugar readings and bring to the next appointment. Encouraged her to follow healthy diet and regular exercise schedule.

## 2022-06-23 NOTE — Progress Notes (Signed)
Established Patient Office Visit  Subjective:  Patient ID: Cindy Shah, female    DOB: 07-11-1961  Age: 61 y.o. MRN: [id number]  CC:  Chief Complaint  Patient presents with   Hypertension   Diabetes     HPI  Cindy Shah presents for 3 month follow up on blood pressure and diabetes. She states that metformin gives are gi issues and is not taking it continuously.    Hypertension Pertinent negatives include no chest pain or shortness of breath.  Diabetes Pertinent negatives for hypoglycemia include no confusion. Pertinent negatives for diabetes include no chest pain.     Past Medical History:  Diagnosis Date   Benign neoplasm of breast    Right   Benign neoplasm of left breast 09/04/2014   Diabetes mellitus without complication ([place])    NIDDM   Fibroadenoma of breast 02/03/2013    Past Surgical History:  Procedure Laterality Date   BREAST EXCISIONAL BIOPSY Left 02-11-14   fibroadenoma   BREAST EXCISIONAL BIOPSY Right 2010   benign   BREAST SURGERY Right 2010   Mass excision   CESAREAN SECTION  1999   CHEST SURGERY  2002   CHOLECYSTECTOMY  1995   FINGER SURGERY  1995    Family History  Problem Relation Age of Onset   Breast cancer Neg Hx    Colon cancer Neg Hx     Social History   Socioeconomic History   Marital status: Married    Spouse name: Not on file   Number of children: Not on file   Years of education: Not on file   Highest education level: Not on file  Occupational History   Not on file  Tobacco Use   Smoking status: Never   Smokeless tobacco: Never  Substance and Sexual Activity   Alcohol use: No   Drug use: No   Sexual activity: Not on file  Other Topics Concern   Not on file  Social History Narrative   Not on file   Social Determinants of Health   Financial Resource Strain: Not on file  Food Insecurity: Not on file  Transportation Needs: Not on file  Physical Activity: Not on file  Stress: Not on file  Social  Connections: Not on file  Intimate Partner Violence: Not on file     Outpatient Medications Prior to Visit  Medication Sig Dispense Refill   glipiZIDE (GLUCOTROL) 5 MG tablet Take 1 tablet (5 mg total) by mouth daily before breakfast. 90 tablet 3   lisinopril (ZESTRIL) 10 MG tablet Take 1 tablet (10 mg total) by mouth daily. 90 tablet 3   metFORMIN (GLUCOPHAGE) 500 MG tablet Take 1 tablet (500 mg total) by mouth daily. 90 tablet 2   simvastatin (ZOCOR) 10 MG tablet Take 1 tablet (10 mg total) by mouth at bedtime. 90 tablet 3   No facility-administered medications prior to visit.    No Known Allergies  ROS Review of Systems  Constitutional: Negative.   HENT: Negative.    Respiratory:  Negative for chest tightness and shortness of breath.   Cardiovascular:  Negative for chest pain and leg swelling.  Gastrointestinal:  Positive for abdominal pain (off and on).  Genitourinary: Negative.   Musculoskeletal: Negative.   Psychiatric/Behavioral:  Negative for agitation, behavioral problems and confusion.       Objective:    Physical Exam Constitutional:      Appearance: Normal appearance. She is obese.  HENT:     Head: Normocephalic.  Right Ear: Tympanic membrane normal.     Left Ear: Tympanic membrane normal.     Nose: Nose normal.     Mouth/Throat:     Mouth: Mucous membranes are moist.     Pharynx: Oropharynx is clear.  Eyes:     Extraocular Movements: Extraocular movements intact.     Conjunctiva/sclera: Conjunctivae normal.     Pupils: Pupils are equal, round, and reactive to light.  Cardiovascular:     Rate and Rhythm: Normal rate and regular rhythm.     Pulses: Normal pulses.     Heart sounds: Normal heart sounds.  Pulmonary:     Effort: Pulmonary effort is normal. No respiratory distress.     Breath sounds: Normal breath sounds. No rhonchi.  Abdominal:     General: Bowel sounds are normal.     Palpations: Abdomen is soft. There is no mass.     Tenderness:  There is no abdominal tenderness.     Hernia: No hernia is present.  Musculoskeletal:        General: Normal range of motion.     Cervical back: Neck supple. No tenderness.  Skin:    General: Skin is warm.     Capillary Refill: Capillary refill takes less than 2 seconds.  Neurological:     General: No focal deficit present.     Mental Status: She is alert and oriented to person, place, and time. Mental status is at baseline.  Psychiatric:        Mood and Affect: Mood normal.        Behavior: Behavior normal.        Thought Content: Thought content normal.        Judgment: Judgment normal.     BP 139/71   Pulse 88   Temp 98.2 F (36.8 C) (Temporal)   Ht _0  (1.448 m)   Wt 168 lb 12.8 oz (76.6 kg)   SpO2 98%   BMI 36.53 kg/m  Wt Readings from Last 3 Encounters:  06/23/22 168 lb 12.8 oz (76.6 kg)  03/23/22 166 lb 11.2 oz (75.6 kg)  06/14/21 171 lb 3.2 oz (77.7 kg)     Health Maintenance  Topic Date Due   FOOT EXAM  Never done   OPHTHALMOLOGY EXAM  Never done   Diabetic kidney evaluation - Urine ACR  Never done   COLONOSCOPY (Pts 45-50yr Insurance coverage will need to be confirmed)  Never done   Zoster Vaccines- Shingrix (1 of 2) Never done   COVID-19 Vaccine (3 - Moderna series) 01/13/2020   MAMMOGRAM  11/09/2021   Diabetic kidney evaluation - GFR measurement  08/02/2022   HEMOGLOBIN A1C  09/23/2022   PAP SMEAR-Modifier  06/23/2024   TETANUS/TDAP  06/15/2031   INFLUENZA VACCINE  Completed   Hepatitis C Screening  Completed   HIV Screening  Completed   HPV VACCINES  Aged Out    There are no preventive care reminders to display for this patient.  Lab Results  Component Value Date   TSH 4.83 (H) 08/02/2021   Lab Results  Component Value Date   WBC 7.5 08/02/2021   HGB 13.6 08/02/2021   HCT 41.2 08/02/2021   MCV 88.8 08/02/2021   PLT 277 08/02/2021   Lab Results  Component Value Date   NA 138 08/02/2021   K 4.1 08/02/2021   CO2 25 08/02/2021    GLUCOSE 160 (H) 08/02/2021   BUN 15 08/02/2021   CREATININE 0.54 08/02/2021   BILITOT  0.7 08/02/2021   AST 23 08/02/2021   ALT 29 08/02/2021   PROT 7.4 08/02/2021   CALCIUM 9.5 08/02/2021   EGFR 107 08/02/2021   Lab Results  Component Value Date   CHOL 171 08/02/2021   Lab Results  Component Value Date   HDL 47 (L) 08/02/2021   Lab Results  Component Value Date   LDLCALC 99 08/02/2021   Lab Results  Component Value Date   TRIG 155 (H) 08/02/2021   Lab Results  Component Value Date   CHOLHDL 3.6 08/02/2021   Lab Results  Component Value Date   HGBA1C 8.1 03/23/2022      Assessment & Plan:   Problem List Items Addressed This Visit       Cardiovascular and Mediastinum   Essential hypertension    Her blood pressure 139/71 in the office today.   Advised patient to increase fluid intake, and low-salt heart healthy diet. Continue lisinopril 10 mg daily. If blood pressures remain elevated during next visit we will adjust medication.        Endocrine   Other specified diabetes mellitus with other specified complication ([place]) - Primary    Her blood sugar 216 in the office today. Started patient on Januvia and continue glipizide Stop taking metformin due to GI side effects. Advised patient to check blood sugar readings and bring to the next appointment. Encouraged her to follow healthy diet and regular exercise schedule.      Relevant Medications   sitaGLIPtin (JANUVIA) 25 MG tablet   Other Relevant Orders   POCT glucose (manual entry) (Completed)     Other   Class 2 severe obesity with serious comorbidity and body mass index (BMI) of 36.0 to 36.9 in adult [facility])   Relevant Medications   sitaGLIPtin (JANUVIA) 25 MG tablet   Other Visit Diagnoses     Colon cancer screening       Relevant Orders   Ambulatory referral to Gastroenterology   Need for immunization against influenza       Relevant Orders   Flu Vaccine QUAD 93moIM (Fluarix, Fluzone & Alfiuria  Quad PF) (Completed)        Meds ordered this encounter  Medications   sitaGLIPtin (JANUVIA) 25 MG tablet    Sig: Take 1 tablet (25 mg total) by mouth daily.    Dispense:  30 tablet    Refill:  1     Follow-up: Return in about 1 month (around 07/23/2022).    CTheresia Lo NP

## 2022-06-23 NOTE — Assessment & Plan Note (Signed)
She has gained 2 pounds since last visit. Eat healthy and balanced diet and follow regular exercise schedule.

## 2022-06-23 NOTE — Assessment & Plan Note (Signed)
Her blood pressure 139/71 in the office today.   Advised patient to increase fluid intake, and low-salt heart healthy diet. Continue lisinopril 10 mg daily. If blood pressures remain elevated during next visit we will adjust medication.

## 2022-06-28 ENCOUNTER — Encounter: Payer: Self-pay | Admitting: Nurse Practitioner

## 2022-06-29 ENCOUNTER — Other Ambulatory Visit: Payer: Self-pay | Admitting: Nurse Practitioner

## 2022-06-29 MED ORDER — METFORMIN HCL ER 500 MG PO TB24: 500.0000 mg | ORAL_TABLET | Freq: Every day | ORAL | 1 refills | Status: AC

## 2022-07-17 ENCOUNTER — Other Ambulatory Visit: Payer: Self-pay | Admitting: Internal Medicine

## 2022-07-17 DIAGNOSIS — E1369 Other specified diabetes mellitus with other specified complication: Secondary | ICD-10-CM

## 2022-09-15 ENCOUNTER — Ambulatory Visit (INDEPENDENT_AMBULATORY_CARE_PROVIDER_SITE_OTHER): Payer: Self-pay | Admitting: Nurse Practitioner

## 2022-09-15 DIAGNOSIS — Z91199 Patient's noncompliance with other medical treatment and regimen due to unspecified reason: Secondary | ICD-10-CM

## 2022-10-09 NOTE — Progress Notes (Signed)
No Show

## 2022-10-19 ENCOUNTER — Ambulatory Visit
Admission: RE | Admit: 2022-10-19 | Discharge: 2022-10-19 | Disposition: A | Discharge status: Home / Self Care | Payer: BC Managed Care – PPO | Source: Ambulatory Visit | Attending: Internal Medicine | Admitting: Internal Medicine

## 2022-10-19 DIAGNOSIS — Z1231 Encounter for screening mammogram for malignant neoplasm of breast: Secondary | ICD-10-CM | POA: Insufficient documentation

## 2023-03-29 ENCOUNTER — Other Ambulatory Visit: Payer: Self-pay | Admitting: Nurse Practitioner

## 2023-03-29 DIAGNOSIS — E1369 Other specified diabetes mellitus with other specified complication: Secondary | ICD-10-CM

## 2023-03-30 NOTE — Telephone Encounter (Signed)
Patient need f/u please call to schedule appointment.

## 2023-04-04 NOTE — Telephone Encounter (Signed)
LVM to call back to schedule f/up per [facility]

## 2023-12-24 ENCOUNTER — Other Ambulatory Visit: Payer: Self-pay | Admitting: Family Medicine

## 2023-12-24 DIAGNOSIS — Z1231 Encounter for screening mammogram for malignant neoplasm of breast: Secondary | ICD-10-CM

## 2024-01-25 ENCOUNTER — Other Ambulatory Visit: Payer: Self-pay

## 2024-01-25 ENCOUNTER — Emergency Department
Admission: EM | Admit: 2024-01-25 | Discharge: 2024-01-25 | Disposition: A | Discharge status: Home / Self Care | Attending: Emergency Medicine | Admitting: Emergency Medicine

## 2024-01-25 DIAGNOSIS — R42 Dizziness and giddiness: Secondary | ICD-10-CM | POA: Diagnosis present

## 2024-01-25 DIAGNOSIS — R739 Hyperglycemia, unspecified: Secondary | ICD-10-CM

## 2024-01-25 DIAGNOSIS — E1165 Type 2 diabetes mellitus with hyperglycemia: Secondary | ICD-10-CM | POA: Insufficient documentation

## 2024-01-25 LAB — CBC WITH DIFFERENTIAL/PLATELET
Abs Immature Granulocytes: 0.03 10*3/uL (ref 0.00–0.07)
Basophils Absolute: 0 10*3/uL (ref 0.0–0.1)
Basophils Relative: 1 %
Eosinophils Absolute: 0 10*3/uL (ref 0.0–0.5)
Eosinophils Relative: 1 %
HCT: 40.3 % (ref 36.0–46.0)
Hemoglobin: 13.6 g/dL (ref 12.0–15.0)
Immature Granulocytes: 0 %
Lymphocytes Relative: 27 %
Lymphs Abs: 2.3 10*3/uL (ref 0.7–4.0)
MCH: 29.9 pg (ref 26.0–34.0)
MCHC: 33.7 g/dL (ref 30.0–36.0)
MCV: 88.6 fL (ref 80.0–100.0)
Monocytes Absolute: 0.7 10*3/uL (ref 0.1–1.0)
Monocytes Relative: 9 %
Neutro Abs: 5.3 10*3/uL (ref 1.7–7.7)
Neutrophils Relative %: 62 %
Platelets: 285 10*3/uL (ref 150–400)
RBC: 4.55 MIL/uL (ref 3.87–5.11)
RDW: 11.9 % (ref 11.5–15.5)
WBC: 8.4 10*3/uL (ref 4.0–10.5)
nRBC: 0 % (ref 0.0–0.2)

## 2024-01-25 LAB — COMPREHENSIVE METABOLIC PANEL WITH GFR
ALT: 40 U/L (ref 0–44)
AST: 29 U/L (ref 15–41)
Albumin: 4 g/dL (ref 3.5–5.0)
Alkaline Phosphatase: 72 U/L (ref 38–126)
Anion gap: 11 (ref 5–15)
BUN: 16 mg/dL (ref 8–23)
CO2: 23 mmol/L (ref 22–32)
Calcium: 9.1 mg/dL (ref 8.9–10.3)
Chloride: 103 mmol/L (ref 98–111)
Creatinine, Ser: 0.54 mg/dL (ref 0.44–1.00)
GFR, Estimated: >60 mL/min (ref >60)
Glucose, Bld: 231 mg/dL — ABNORMAL HIGH (ref 70–99)
Potassium: 3.9 mmol/L (ref 3.5–5.1)
Sodium: 137 mmol/L (ref 135–145)
Total Bilirubin: 0.9 mg/dL (ref 0.0–1.2)
Total Protein: 7.5 g/dL (ref 6.5–8.1)

## 2024-01-25 LAB — URINALYSIS, ROUTINE W REFLEX MICROSCOPIC
Bilirubin Urine: NEGATIVE
Glucose, UA: 150 mg/dL — AB
Hgb urine dipstick: NEGATIVE
Ketones, ur: NEGATIVE mg/dL
Nitrite: NEGATIVE
Protein, ur: NEGATIVE mg/dL
Specific Gravity, Urine: 1.023 (ref 1.005–1.030)
pH: 5 (ref 5.0–8.0)

## 2024-01-25 LAB — TROPONIN I (HIGH SENSITIVITY): Troponin I (High Sensitivity): 4 ng/L (ref <18)

## 2024-01-25 MED ORDER — MECLIZINE HCL 25 MG PO TABS
25.0000 mg | ORAL_TABLET | Freq: Once | ORAL | Status: AC
  Administered 2024-01-25: 25 mg via ORAL
  Filled 2024-01-25: qty 1

## 2024-01-25 MED ORDER — LACTATED RINGERS IV BOLUS
1000.0000 mL | Freq: Once | INTRAVENOUS | Status: AC
  Administered 2024-01-25: 1000 mL via INTRAVENOUS

## 2024-01-25 MED ORDER — MECLIZINE HCL 25 MG PO TABS: 25.0000 mg | ORAL_TABLET | Freq: Three times a day (TID) | ORAL | 0 refills | Status: AC | PRN

## 2024-01-25 NOTE — Discharge Instructions (Addendum)
 Stay well-hydrated with Gatorade, Pedialyte. Start your  diabetes medicine Take meclizine as needed for dizziness not improving with the above Follow-up with your primary care doctor return to the ER for worsening symptoms or any other concerns

## 2024-01-25 NOTE — ED Provider Notes (Signed)
 [facility] Provider Note    Event Date/Time   First MD Initiated Contact with Patient 01/25/24 [phone]     (approximate)   History   Dizziness   HPI  Cindy Shah is a 63 y.o. female with diabetes who comes in with dizziness and weakness.  Patient reports that she has been having off-and-on dizziness for some time now.  She reports she went to her PCP doctor on Monday and they thought it was related to the metformin  so they stopped her metformin  told her to start Januvia  when her dizziness went away.  She reports that her dizziness never went away and so she has not started the Januvia  yet.  She reports the dizziness is only when she stands up where she feels like the room is spinning and she is going to fall over.  She denies any falls, hitting her head, chest pain, shortness of breath, abdominal pain.  She denies any dizziness just laying there.  She reports this has been worsening over the past 2 days but has been ongoing for over a week.  Spanish interpreter was used  Physical Exam   Triage Vital Signs: ED Triage Vitals  Encounter Vitals Group     BP      Girls Systolic BP Percentile      Girls Diastolic BP Percentile      Boys Systolic BP Percentile      Boys Diastolic BP Percentile      Pulse      Resp      Temp      Temp src      SpO2      Weight      Height      Head Circumference      Peak Flow      Pain Score      Pain Loc      Pain Education      Exclude from Growth Chart     Most recent vital signs: Vitals:   01/25/24 0935 01/25/24 0936  BP:    Pulse: 72   Resp: 17   Temp:  97.8 F (36.6 C)  SpO2: 94%      General: Awake, no distress.  CV:  Good peripheral perfusion.  Resp:  Normal effort.  Abd:  No distention.  Soft and nontender Other:  Cranial nerves II through XII are intact.  Equal strength in arms and legs.  Here to his chin is normal.  No nystagmus.  Finger-to-nose normal No swelling in legs.  No calf  tenderness  ED Results / Procedures / Treatments   Labs (all labs ordered are listed, but only abnormal results are displayed) Labs Reviewed  COMPREHENSIVE METABOLIC PANEL WITH GFR  CBC WITH DIFFERENTIAL/PLATELET  URINALYSIS, ROUTINE W REFLEX MICROSCOPIC  CBC WITH DIFFERENTIAL/PLATELET  TROPONIN I (HIGH SENSITIVITY)     EKG  My interpretation of EKG:  Normal sinus rhythm 75 without any ST elevation or T wave inversions, normal intervals  RADIOLOGY    PROCEDURES:  Critical Care performed: No  .1-3 Lead EKG Interpretation  Performed by: Lubertha Rush, MD Authorized by: Lubertha Rush, MD     Interpretation: normal     ECG rate:  70   ECG rate assessment: normal     Rhythm: sinus rhythm     Ectopy: none     Conduction: normal      MEDICATIONS ORDERED IN ED: Medications  meclizine (ANTIVERT) tablet 25 mg (has  no administration in time range)  lactated ringers bolus 1,000 mL (1,000 mLs Intravenous New Bag/Given 01/25/24 0932)     IMPRESSION / MDM / ASSESSMENT AND PLAN / ED COURSE  I reviewed the triage vital signs and the nursing notes.   Patient's presentation is most consistent with acute presentation with potential threat to life or bodily function.   Suspect more likely dehydration, hyperglycemia, electrolyte abnormalities, vertigo.  To consider the possibility of posterior stroke stroke code not called given is been ongoing for a few days now but I do suspect that this is not a posterior stroke it is positional in nature and her cranial nerve exam is normal including finger-nose, heel-to-shin.  Will treat with fluids, meclizine and reevaluate  UA without evidence of UTI.  Troponin is negative symptoms been ongoing for greater than 3 hours do not need repeat   Initial sat was noted be 94% but patient has no shortness of breath and when I was in the room her oxygen levels are normal.  No evidence of DVT  CBC is normal CMP shows elevated glucose  urine.  Patient symptoms seem more positional in nature suspect vertigo versus dehydration from hyperglycemia.  After liter of fluid, meclizine patient is ambulatory with significant improvement in symptoms.  She is got no vertigo on examination.  At this time I have low suspicion for posterior stroke.  Patient feels comfortable with discharge home will return to emergency room if she develops worsening symptoms or other concerns.  This time I discussed with her starting her medication Januvia  that her doctor recently prescribed to see if this helps improve his symptoms, staying well-hydrated with Gatorade, Pedialyte and trialing meclizine and returning to emergency room for worsening symptoms otherwise can follow-up with PCP   The patient is on the cardiac monitor to evaluate for evidence of arrhythmia and/or significant heart rate changes.      FINAL CLINICAL IMPRESSION(S) / ED DIAGNOSES   Final diagnoses:  Dizziness  Hyperglycemia  Vertigo     Rx / DC Orders   ED Discharge Orders          Ordered    meclizine (ANTIVERT) 25 MG tablet  3 times daily PRN        01/25/24 1218             Note:  This document was prepared using Dragon voice recognition software and may include unintentional dictation errors.   Lubertha Rush, MD 01/25/24 1218

## 2024-01-25 NOTE — ED Notes (Signed)
 First nurse note: Pt here from [facility] clinic with c/o dizziness and weakness since 6/8. Pt states dizziness when she moves her head. KC states metformin  was new in April, pt started being dizzy after Metformin , pt recently stopped due to dizziness.

## 2024-01-25 NOTE — ED Notes (Signed)
 Patient is a fall risk and the fall risk bundle was put in place at this time. Fall risk bracelet was on, bed alarm was turned on, and non-slip socks were put on.

## 2024-01-25 NOTE — ED Triage Notes (Signed)
See First Nurse note.
